# Patient Record
Sex: Female | Born: 1965 | Race: Black or African American | Hispanic: No | Marital: Single | Smoking: Current every day smoker
Health system: Southern US, Community
[De-identification: ages and names within clinical notes are randomized; demographics above are authoritative.]

## PROBLEM LIST (undated history)

## (undated) HISTORY — PX: SKIN BIOPSY: SHX1

---

## 2004-10-04 ENCOUNTER — Emergency Department: Payer: Self-pay | Admitting: Unknown Physician Specialty

## 2005-05-02 ENCOUNTER — Emergency Department: Payer: Self-pay | Admitting: Emergency Medicine

## 2005-05-16 ENCOUNTER — Emergency Department: Payer: Self-pay | Admitting: Emergency Medicine

## 2005-05-16 ENCOUNTER — Other Ambulatory Visit: Payer: Self-pay

## 2005-06-27 ENCOUNTER — Emergency Department: Payer: Self-pay | Admitting: Emergency Medicine

## 2005-09-19 ENCOUNTER — Emergency Department: Payer: Self-pay | Admitting: Internal Medicine

## 2006-12-27 ENCOUNTER — Emergency Department (HOSPITAL_COMMUNITY): Admission: EM | Admit: 2006-12-27 | Discharge: 2006-12-28 | Payer: Self-pay | Admitting: Emergency Medicine

## 2007-12-11 ENCOUNTER — Ambulatory Visit: Payer: Self-pay | Admitting: Internal Medicine

## 2009-02-11 ENCOUNTER — Ambulatory Visit: Payer: Self-pay | Admitting: Internal Medicine

## 2009-02-23 ENCOUNTER — Ambulatory Visit: Payer: Self-pay | Admitting: Internal Medicine

## 2011-01-06 LAB — URINALYSIS, ROUTINE W REFLEX MICROSCOPIC
Glucose, UA: NEGATIVE
Nitrite: NEGATIVE
Specific Gravity, Urine: 1.024
pH: 5.5

## 2011-01-06 LAB — URINE MICROSCOPIC-ADD ON

## 2011-01-06 LAB — URINE CULTURE

## 2012-09-02 ENCOUNTER — Emergency Department: Payer: Self-pay | Admitting: Emergency Medicine

## 2013-09-15 ENCOUNTER — Emergency Department: Payer: Self-pay | Admitting: Emergency Medicine

## 2013-10-15 ENCOUNTER — Emergency Department: Payer: Self-pay | Admitting: Internal Medicine

## 2013-10-15 LAB — COMPREHENSIVE METABOLIC PANEL
ALK PHOS: 82 U/L
ANION GAP: 5 — AB (ref 7–16)
AST: 38 U/L — AB (ref 15–37)
Albumin: 3.5 g/dL (ref 3.4–5.0)
BILIRUBIN TOTAL: 0.3 mg/dL (ref 0.2–1.0)
BUN: 8 mg/dL (ref 7–18)
Calcium, Total: 8.9 mg/dL (ref 8.5–10.1)
Chloride: 105 mmol/L (ref 98–107)
Co2: 28 mmol/L (ref 21–32)
Creatinine: 0.56 mg/dL — ABNORMAL LOW (ref 0.60–1.30)
Glucose: 91 mg/dL (ref 65–99)
Osmolality: 274 (ref 275–301)
Potassium: 4.5 mmol/L (ref 3.5–5.1)
SGPT (ALT): 22 U/L (ref 12–78)
SODIUM: 138 mmol/L (ref 136–145)
TOTAL PROTEIN: 7.7 g/dL (ref 6.4–8.2)

## 2013-10-15 LAB — URINALYSIS, COMPLETE
BACTERIA: NONE SEEN
BLOOD: NEGATIVE
Bilirubin,UR: NEGATIVE
Glucose,UR: NEGATIVE mg/dL (ref 0–75)
Ketone: NEGATIVE
LEUKOCYTE ESTERASE: NEGATIVE
Nitrite: NEGATIVE
PH: 5 (ref 4.5–8.0)
Protein: NEGATIVE
RBC,UR: <1 /HPF (ref 0–5)
SPECIFIC GRAVITY: 1.005 (ref 1.003–1.030)
Squamous Epithelial: <1
WBC UR: NONE SEEN /HPF (ref 0–5)

## 2013-10-15 LAB — CBC
HCT: 38.5 % (ref 35.0–47.0)
HGB: 13.2 g/dL (ref 12.0–16.0)
MCH: 30.1 pg (ref 26.0–34.0)
MCHC: 34.3 g/dL (ref 32.0–36.0)
MCV: 88 fL (ref 80–100)
Platelet: 277 10*3/uL (ref 150–440)
RBC: 4.38 10*6/uL (ref 3.80–5.20)
RDW: 14 % (ref 11.5–14.5)
WBC: 9.7 10*3/uL (ref 3.6–11.0)

## 2014-06-19 ENCOUNTER — Emergency Department: Payer: Self-pay | Admitting: Emergency Medicine

## 2014-07-07 ENCOUNTER — Emergency Department: Admit: 2014-07-07 | Disposition: A | Discharge status: Home / Self Care | Payer: Self-pay | Admitting: Student

## 2014-10-24 ENCOUNTER — Encounter: Payer: Self-pay | Admitting: *Deleted

## 2014-10-24 ENCOUNTER — Emergency Department
Admission: EM | Admit: 2014-10-24 | Discharge: 2014-10-24 | Disposition: A | Discharge status: Home / Self Care | Payer: Self-pay | Attending: Emergency Medicine | Admitting: Emergency Medicine

## 2014-10-24 DIAGNOSIS — Z880 Allergy status to penicillin: Secondary | ICD-10-CM | POA: Insufficient documentation

## 2014-10-24 DIAGNOSIS — Z720 Tobacco use: Secondary | ICD-10-CM | POA: Insufficient documentation

## 2014-10-24 DIAGNOSIS — L239 Allergic contact dermatitis, unspecified cause: Secondary | ICD-10-CM | POA: Insufficient documentation

## 2014-10-24 DIAGNOSIS — Z79899 Other long term (current) drug therapy: Secondary | ICD-10-CM | POA: Insufficient documentation

## 2014-10-24 DIAGNOSIS — L508 Other urticaria: Secondary | ICD-10-CM

## 2014-10-24 MED ORDER — RANITIDINE HCL 150 MG PO TABS: 150.0000 mg | ORAL_TABLET | Freq: Two times a day (BID) | ORAL | Status: DC

## 2014-10-24 MED ORDER — FAMOTIDINE 20 MG PO TABS
40.0000 mg | ORAL_TABLET | Freq: Once | ORAL | Status: AC
  Administered 2014-10-24: 40 mg via ORAL
  Filled 2014-10-24: qty 2

## 2014-10-24 MED ORDER — METHYLPREDNISOLONE 4 MG PO TBPK: ORAL_TABLET | ORAL | Status: DC

## 2014-10-24 MED ORDER — CYPROHEPTADINE HCL 4 MG PO TABS: 4.0000 mg | ORAL_TABLET | Freq: Three times a day (TID) | ORAL | Status: DC | PRN

## 2014-10-24 MED ORDER — CYPROHEPTADINE HCL 4 MG PO TABS
4.0000 mg | ORAL_TABLET | Freq: Three times a day (TID) | ORAL | Status: DC | PRN
Start: 2014-10-24 — End: 2014-10-24
  Filled 2014-10-24: qty 1

## 2014-10-24 MED ORDER — DEXAMETHASONE SODIUM PHOSPHATE 10 MG/ML IJ SOLN
10.0000 mg | Freq: Once | INTRAMUSCULAR | Status: AC
  Administered 2014-10-24: 10 mg via INTRAMUSCULAR
  Filled 2014-10-24: qty 1

## 2014-10-24 NOTE — ED Notes (Signed)
This am developed rash and some swelling on left hand

## 2014-10-24 NOTE — ED Provider Notes (Signed)
[facility] Emergency Department Provider Note ____________________________________________  Time seen: 0730  I have reviewed the triage vital signs and the nursing notes.  HISTORY  Chief Complaint  Rash  HPI Vanessa Jacobs is a 49 y.o. female who reports to the ED for evaluation of a rash that she developed just after she got in the shower this morning. She reports extremely itchy rash that seems to have spread from her arms up towards her torso, and also into her scalp. She denies any known exposure, but does report a history of severely sensitive skin, and previous hives outbreak. She normally doses Benadryl or Claritin daily for allergy relief as well as a multivitamin. She denies any particular exposure in the last 24 hours any new foods, allergens, or irritation contacts. She is without any swelling/edema/inflammation to the mouth, lips, or throat; and denies any shortness of breath or difficulty swallowing.  History reviewed. No pertinent past medical history.  There are no active problems to display for this patient.  Past Surgical History  Procedure Laterality Date  . Skin biopsy     Current Outpatient Rx  Name  Route  Sig  Dispense  Refill  . cyproheptadine (PERIACTIN) 4 MG tablet   Oral   Take 1 tablet (4 mg total) by mouth 3 (three) times daily as needed for allergies.   30 tablet   0   . methylPREDNISolone (MEDROL DOSEPAK) 4 MG TBPK tablet      Dose pack as directed   21 tablet   0   . ranitidine (ZANTAC) 150 MG tablet   Oral   Take 1 tablet (150 mg total) by mouth 2 (two) times daily.   20 tablet   0     Allergies Penicillins  No family history on file.  Social History History  Substance Use Topics  . Smoking status: Current Every Day Smoker  . Smokeless tobacco: Not on file  . Alcohol Use: No   Review of Systems  Constitutional: Negative for fever. Eyes: Negative for visual changes. ENT: Negative for sore  throat. Cardiovascular: Negative for chest pain. Respiratory: Negative for shortness of breath. Gastrointestinal: Negative for abdominal pain, vomiting and diarrhea. Genitourinary: Negative for dysuria. Musculoskeletal: Negative for back pain. Skin: Postive for rash. Neurological: Negative for headaches, focal weakness or numbness. ____________________________________________  PHYSICAL EXAM:  VITAL SIGNS: ED Triage Vitals  Enc Vitals Group     BP 10/24/14 0714 117/64 mmHg     Pulse Rate 10/24/14 0714 67     Resp 10/24/14 0714 20     Temp 10/24/14 0714 97.8 F (36.6 C)     Temp Source 10/24/14 0714 Oral     SpO2 10/24/14 0714 100 %     Weight 10/24/14 0714 122 lb (55.339 kg)     Height 10/24/14 0714 5\' 3"  (1.6 m)     Head Cir --      Peak Flow --      Pain Score --      Pain Loc --      Pain Edu? --      Excl. in GC? --    Constitutional: Alert and oriented. Well appearing and in no distress. Eyes: Conjunctivae are normal. PERRL. Normal extraocular movements. ENT   Head: Normocephalic and atraumatic.   Nose: No congestion/rhinnorhea.   Mouth/Throat: Mucous membranes are moist.   Neck: Supple. No thyromegaly. Hematological/Lymphatic/Immunilogical: No cervical lymphadenopathy. Cardiovascular: Normal rate, regular rhythm.  Respiratory: Normal respiratory effort. No wheezes/rales/rhonchi. Musculoskeletal:  Nontender with normal range of motion in all extremities.  Neurologic:  Normal gait without ataxia. Normal speech and language. No gross focal neurologic deficits are appreciated. Skin:  Skin is warm, dry and intact. Scattered, erythematous, papular rash noted to the arms, torso, and legs. Psychiatric: Mood and affect are normal. Patient exhibits appropriate insight and judgment. ____________________________________________  PROCEDURES  Famotodine 40 mg PO Decadron 10 mg IM ____________________________________________  INITIAL IMPRESSION / ASSESSMENT AND  PLAN / ED COURSE  Acute allergic dermatitis to unknown trigger. Consider idiopathic hives. Treatment of acute histamine response with antihistamine & anti-inflammatory medicines in the ED. Prescription for Medrol dose pack, Periactin, & ranitidine provided.  Work note for [facility] x 2 days provided as requested.  ____________________________________________  FINAL CLINICAL IMPRESSION(S) / ED DIAGNOSES  Final diagnoses:  Allergic dermatitis  Urticaria, acute     Lissa Hoard, PA-C 10/24/14 0757  Darien Ramus, MD 10/24/14 1539

## 2014-10-24 NOTE — ED Notes (Signed)
Scattered rash on skin, burns, then developed some swelling on her left hand

## 2014-10-24 NOTE — Discharge Instructions (Signed)
Allergies  Allergies may happen from anything your body is sensitive to. This may be food, medicines, pollens, chemicals, and many other things. Food allergies can be severe and deadly.  HOME CARE  If you do not know what causes a reaction, keep a diary. Write down the foods you ate and the symptoms that followed. Avoid foods that cause reactions.  If you have red raised spots (hives) or a rash:  Take medicine as told by your doctor.  Use medicines for red raised spots and itching as needed.  Apply cold cloths (compresses) to the skin. Take a cool bath. Avoid hot baths or showers.  If you are severely allergic:  It is often necessary to go to the hospital after you have treated your reaction.  Wear your medical alert jewelry.  You and your family must learn how to give a allergy shot or use an allergy kit (anaphylaxis kit).  Always carry your allergy kit or shot with you. Use this medicine as told by your doctor if a severe reaction is occurring. GET HELP RIGHT AWAY IF:  You have trouble breathing or are making high-pitched whistling sounds (wheezing).  You have a tight feeling in your chest or throat.  You have a puffy (swollen) mouth.  You have red raised spots, puffiness (swelling), or itching all over your body.  You have had a severe reaction that was helped by your allergy kit or shot. The reaction can return once the medicine has worn off.  You think you are having a food allergy. Symptoms most often happen within 30 minutes of eating a food.  Your symptoms have not gone away within 2 days or are getting worse.  You have new symptoms.  You want to retest yourself with a food or drink you think causes an allergic reaction. Only do this under the care of a doctor. MAKE SURE YOU:   Understand these instructions.  Will watch your condition.  Will get help right away if you are not doing well or get worse. Document Released: 07/09/2012 Document Reviewed:  07/09/2012 [facility] Patient Information 2015 [place]. This information is not intended to replace advice given to you by your health care provider. Make sure you discuss any questions you have with your health care provider.  Hives Hives are itchy, red, swollen areas of the skin. They can vary in size and location on your body. Hives can come and go for hours or several days (acute hives) or for several weeks (chronic hives). Hives do not spread from person to person (noncontagious). They may get worse with scratching, exercise, and emotional stress. CAUSES   Allergic reaction to food, additives, or drugs.  Infections, including the common cold.  Illness, such as vasculitis, lupus, or thyroid disease.  Exposure to sunlight, heat, or cold.  Exercise.  Stress.  Contact with chemicals. SYMPTOMS   Red or white swollen patches on the skin. The patches may change size, shape, and location quickly and repeatedly.  Itching.  Swelling of the hands, feet, and face. This may occur if hives develop deeper in the skin. DIAGNOSIS  Your caregiver can usually tell what is wrong by performing a physical exam. Skin or blood tests may also be done to determine the cause of your hives. In some cases, the cause cannot be determined. TREATMENT  Mild cases usually get better with medicines such as antihistamines. Severe cases may require an emergency epinephrine injection. If the cause of your hives is known, treatment includes avoiding  that trigger.  HOME CARE INSTRUCTIONS   Avoid causes that trigger your hives.  Take antihistamines as directed by your caregiver to reduce the severity of your hives. Non-sedating or low-sedating antihistamines are usually recommended. Do not drive while taking an antihistamine.  Take any other medicines prescribed for itching as directed by your caregiver.  Wear loose-fitting clothing.  Keep all follow-up appointments as directed by your caregiver. SEEK  MEDICAL CARE IF:   You have persistent or severe itching that is not relieved with medicine.  You have painful or swollen joints. SEEK IMMEDIATE MEDICAL CARE IF:   You have a fever.  Your tongue or lips are swollen.  You have trouble breathing or swallowing.  You feel tightness in the throat or chest.  You have abdominal pain. These problems may be the first sign of a life-threatening allergic reaction. Call your local emergency services (911 in [place] MAKE SURE YOU:   Understand these instructions.  Will watch your condition.  Will get help right away if you are not doing well or get worse. Document Released: 03/14/2005 Document Revised: 03/19/2013 Document Reviewed: 06/07/2011 [facility] Patient Information 2015 [place], [place]. This information is not intended to replace advice given to you by your health care provider. Make sure you discuss any questions you have with your health care provider.  Take the prescription steroid as directed. Dose the ranitidine twice daily for allergy relief.  Continue your daily anti-histamine as before. Follow-up with [facility] as needed.

## 2014-10-27 ENCOUNTER — Encounter: Payer: Self-pay | Admitting: Emergency Medicine

## 2014-10-27 ENCOUNTER — Emergency Department
Admission: EM | Admit: 2014-10-27 | Discharge: 2014-10-27 | Disposition: A | Discharge status: Home / Self Care | Payer: Self-pay | Attending: Student | Admitting: Student

## 2014-10-27 DIAGNOSIS — Y999 Unspecified external cause status: Secondary | ICD-10-CM | POA: Insufficient documentation

## 2014-10-27 DIAGNOSIS — X58XXXA Exposure to other specified factors, initial encounter: Secondary | ICD-10-CM | POA: Insufficient documentation

## 2014-10-27 DIAGNOSIS — Y939 Activity, unspecified: Secondary | ICD-10-CM | POA: Insufficient documentation

## 2014-10-27 DIAGNOSIS — Z880 Allergy status to penicillin: Secondary | ICD-10-CM | POA: Insufficient documentation

## 2014-10-27 DIAGNOSIS — Z720 Tobacco use: Secondary | ICD-10-CM | POA: Insufficient documentation

## 2014-10-27 DIAGNOSIS — Y929 Unspecified place or not applicable: Secondary | ICD-10-CM | POA: Insufficient documentation

## 2014-10-27 DIAGNOSIS — Z79899 Other long term (current) drug therapy: Secondary | ICD-10-CM | POA: Insufficient documentation

## 2014-10-27 DIAGNOSIS — S46912A Strain of unspecified muscle, fascia and tendon at shoulder and upper arm level, left arm, initial encounter: Secondary | ICD-10-CM | POA: Insufficient documentation

## 2014-10-27 MED ORDER — CYCLOBENZAPRINE HCL 10 MG PO TABS: 10.0000 mg | ORAL_TABLET | Freq: Three times a day (TID) | ORAL | Status: DC | PRN

## 2014-10-27 MED ORDER — TRAMADOL HCL 50 MG PO TABS
50.0000 mg | ORAL_TABLET | Freq: Four times a day (QID) | ORAL | Status: DC | PRN
Start: 2014-10-27 — End: 2015-07-16

## 2014-10-27 NOTE — ED Notes (Signed)
Pt here with c/o left shoulder pain and left arm pain, reports it radiates into back. Pt reports pain started this pain, reports it as a sharp pain with movement of left arm.

## 2014-10-27 NOTE — ED Provider Notes (Signed)
[facility] Emergency Department Provider Note  ____________________________________________  Time seen: Approximately 9:42 AM  I have reviewed the triage vital signs and the nursing notes.   HISTORY  Chief Complaint Shoulder Pain    HPI NEOMA UHRICH is a 49 y.o. female complaining of left posterior shoulder pain in the scapular area for one day. Patient states the pain in the inferior scapular area which increases with movement of the left shoulder. Patient also states that she take a deep breath she can feel pain in the posterior part of her back. Patient rates the pain as 8/10 describe it as sharp. No palliative measures for this complaint.   History reviewed. No pertinent past medical history.  There are no active problems to display for this patient.   Past Surgical History  Procedure Laterality Date  . Skin biopsy      Current Outpatient Rx  Name  Route  Sig  Dispense  Refill  . cyproheptadine (PERIACTIN) 4 MG tablet   Oral   Take 1 tablet (4 mg total) by mouth 3 (three) times daily as needed for allergies.   30 tablet   0   . methylPREDNISolone (MEDROL DOSEPAK) 4 MG TBPK tablet      Dose pack as directed   21 tablet   0   . ranitidine (ZANTAC) 150 MG tablet   Oral   Take 1 tablet (150 mg total) by mouth 2 (two) times daily.   20 tablet   0     Allergies Penicillins  No family history on file.  Social History History  Substance Use Topics  . Smoking status: Current Every Day Smoker    Types: Cigarettes  . Smokeless tobacco: Not on file  . Alcohol Use: No    Review of Systems Constitutional: No fever/chills Eyes: No visual changes. ENT: No sore throat. Cardiovascular: Denies chest pain. Respiratory: Denies shortness of breath. Gastrointestinal: No abdominal pain.  No nausea, no vomiting.  No diarrhea.  No constipation. Genitourinary: Negative for dysuria. Musculoskeletal: Left shoulder pain. Skin: Negative for  rash. Neurological: Negative for headaches, focal weakness or numbness. 10-point ROS otherwise negative.  ____________________________________________   PHYSICAL EXAM:  VITAL SIGNS: ED Triage Vitals  Enc Vitals Group     BP 10/27/14 0921 130/72 mmHg     Pulse Rate 10/27/14 0921 59     Resp 10/27/14 0921 19     Temp 10/27/14 0921 98.2 F (36.8 C)     Temp Source 10/27/14 0921 Oral     SpO2 10/27/14 0921 99 %     Weight 10/27/14 0921 135 lb (61.236 kg)     Height 10/27/14 0921  (1.6 m)     Head Cir --      Peak Flow --      Pain Score 10/27/14 0922 8     Pain Loc --      Pain Edu? --      Excl. in GC? --     Constitutional: Alert and oriented. Well appearing and in no acute distress. Eyes: Conjunctivae are normal. PERRL. EOMI. Head: Atraumatic. Nose: No congestion/rhinnorhea. Mouth/Throat: Mucous membranes are moist.  Oropharynx non-erythematous. Neck: No stridor.  No cervical spine tenderness to palpation. Hematological/Lymphatic/Immunilogical: No cervical lymphadenopathy. Cardiovascular: Normal rate, regular rhythm. Grossly normal heart sounds.  Good peripheral circulation. Respiratory: Normal respiratory effort.  No retractions. Lungs CTAB. Gastrointestinal: Soft and nontender. No distention. No abdominal bruits. No CVA tenderness. Musculoskeletal: No deformity left upper extremity. Patient  has some moderate guarding palpation inferior scapular muscle area. Decreased range of motion with adduction and overhead reaching of the left upper extremity. Left lower extremity is neurovascularly intact.  Neurologic:  Normal speech and language. No gross focal neurologic deficits are appreciated. No gait instability. Skin:  Skin is warm, dry and intact. No rash noted. Psychiatric: Mood and affect are normal. Speech and behavior are normal.  ____________________________________________   LABS (all labs ordered are listed, but only abnormal results are displayed)  Labs  Reviewed - No data to display ____________________________________________  EKG   ____________________________________________  RADIOLOGY   ____________________________________________   PROCEDURES  Procedure(s) performed: None  Critical Care performed: No  ____________________________________________   INITIAL IMPRESSION / ASSESSMENT AND PLAN / ED COURSE  Pertinent labs & imaging results that were available during my care of the patient were reviewed by me and considered in my medical decision making (see chart for details).  Left scapular muscle strain. Patient is given a sling to wear for the next 2-3 days. Patient given prescription for tramadol and ibuprofen. Patient given a work note. [facility]. ____________________________________________   FINAL CLINICAL IMPRESSION(S) / ED DIAGNOSES  Final diagnoses:  Muscle strain of left scapular region, initial encounter      Joni Reining, PA-C 10/27/14 1610  Gayla Doss, MD 10/27/14 1547

## 2015-02-20 ENCOUNTER — Emergency Department
Admission: EM | Admit: 2015-02-20 | Discharge: 2015-02-20 | Disposition: A | Discharge status: Home / Self Care | Payer: Self-pay | Attending: Emergency Medicine | Admitting: Emergency Medicine

## 2015-02-20 ENCOUNTER — Encounter: Payer: Self-pay | Admitting: Emergency Medicine

## 2015-02-20 DIAGNOSIS — L509 Urticaria, unspecified: Secondary | ICD-10-CM | POA: Insufficient documentation

## 2015-02-20 DIAGNOSIS — F17210 Nicotine dependence, cigarettes, uncomplicated: Secondary | ICD-10-CM | POA: Insufficient documentation

## 2015-02-20 DIAGNOSIS — Z880 Allergy status to penicillin: Secondary | ICD-10-CM | POA: Insufficient documentation

## 2015-02-20 DIAGNOSIS — L309 Dermatitis, unspecified: Secondary | ICD-10-CM | POA: Insufficient documentation

## 2015-02-20 MED ORDER — HYDROXYZINE HCL 50 MG PO TABS: 50.0000 mg | ORAL_TABLET | Freq: Three times a day (TID) | ORAL | Status: DC | PRN

## 2015-02-20 MED ORDER — DEXAMETHASONE SODIUM PHOSPHATE 10 MG/ML IJ SOLN
INTRAMUSCULAR | Status: AC
  Administered 2015-02-20: 10 mg via INTRAMUSCULAR
  Filled 2015-02-20: qty 1

## 2015-02-20 MED ORDER — HYDROCORTISONE VALERATE 0.2 % EX OINT: TOPICAL_OINTMENT | CUTANEOUS | Status: AC

## 2015-02-20 MED ORDER — METHYLPREDNISOLONE 4 MG PO TBPK: ORAL_TABLET | ORAL | Status: DC

## 2015-02-20 MED ORDER — HYDROXYZINE HCL 25 MG PO TABS
ORAL_TABLET | ORAL | Status: AC
  Administered 2015-02-20: 50 mg via ORAL
  Filled 2015-02-20: qty 2

## 2015-02-20 MED ORDER — DEXAMETHASONE SODIUM PHOSPHATE 10 MG/ML IJ SOLN
10.0000 mg | Freq: Once | INTRAMUSCULAR | Status: AC
  Administered 2015-02-20: 10 mg via INTRAMUSCULAR

## 2015-02-20 MED ORDER — HYDROXYZINE HCL 50 MG PO TABS
50.0000 mg | ORAL_TABLET | Freq: Once | ORAL | Status: AC
  Administered 2015-02-20: 50 mg via ORAL

## 2015-02-20 NOTE — ED Notes (Signed)
Pt c/o rash that she first noticed yesterday that is spread throughout her body. Pt took Zyrtec and generic benadryl yesterday. Did not help much with itching.  Did not change laundry detergent and recently went out and picked salad. Reports left arm weeps and crusty.

## 2015-02-20 NOTE — ED Notes (Signed)
RASH ALL OVER SINCE YESTERDAY.  SOME WITH PUS.

## 2015-02-20 NOTE — ED Provider Notes (Signed)
[facility] Emergency Department Provider Note  ____________________________________________  Time seen: Approximately 12:11 PM  I have reviewed the triage vital signs and the nursing notes.   HISTORY  Chief Complaint Rash    HPI Vanessa Jacobs is a 49 y.o. female complaining of a rash times one day. He states she knows a rash yesterday on her arms and neck and aspirated total body. Patient states she is taking some over-the-counter Benadryl and Zyrtec which did not help with the itching. Patient denies any recent change along she is on hygiene products. He stated 2 days ago she did go out to a restaurant and atypical cells. Patient has a history of eczema was seen to be compounded with this rash. Patient rates her discomfort as 8/10.   History reviewed. No pertinent past medical history.  There are no active problems to display for this patient.   Past Surgical History  Procedure Laterality Date  . Skin biopsy      Current Outpatient Rx  Name  Route  Sig  Dispense  Refill  . cyclobenzaprine (FLEXERIL) 10 MG tablet   Oral   Take 1 tablet (10 mg total) by mouth every 8 (eight) hours as needed for muscle spasms.   15 tablet   0   . cyproheptadine (PERIACTIN) 4 MG tablet   Oral   Take 1 tablet (4 mg total) by mouth 3 (three) times daily as needed for allergies.   30 tablet   0   . methylPREDNISolone (MEDROL DOSEPAK) 4 MG TBPK tablet      Dose pack as directed   21 tablet   0   . ranitidine (ZANTAC) 150 MG tablet   Oral   Take 1 tablet (150 mg total) by mouth 2 (two) times daily.   20 tablet   0   . traMADol (ULTRAM) 50 MG tablet   Oral   Take 1 tablet (50 mg total) by mouth every 6 (six) hours as needed for moderate pain.   12 tablet   0     Allergies Penicillins  No family history on file.  Social History Social History  Substance Use Topics  . Smoking status: Current Every Day Smoker    Types: Cigarettes  . Smokeless  tobacco: None  . Alcohol Use: No    Review of Systems Constitutional: No fever/chills Eyes: No visual changes. ENT: No sore throat. Cardiovascular: Denies chest pain. Respiratory: Denies shortness of breath. Gastrointestinal: No abdominal pain.  No nausea, no vomiting.  No diarrhea.  No constipation. Genitourinary: Negative for dysuria. Musculoskeletal: Negative for back pain. Skin: Positive for rash. Eczema Neurological: Negative for headaches, focal weakness or numbness. Hematological/Lymphatic: Allergic/Immunilogical: Penicillin 10-point ROS otherwise negative.  ____________________________________________   PHYSICAL EXAM:  VITAL SIGNS: ED Triage Vitals  Enc Vitals Group     BP --      Pulse Rate 02/20/15 1102 65     Resp 02/20/15 1102 14     Temp 02/20/15 1102 98.1 F (36.7 C)     Temp Source 02/20/15 1102 Oral     SpO2 02/20/15 1102 100 %     Weight 02/20/15 1102 128 lb (58.06 kg)     Height 02/20/15 1102  (1.6 m)     Head Cir --      Peak Flow --      Pain Score 02/20/15 1103 8     Pain Loc --      Pain Edu? --  Excl. in GC? --    Constitutional: Alert and oriented. Well appearing and in no acute distress. Eyes: Conjunctivae are normal. PERRL. EOMI. Head: Atraumatic. Nose: No congestion/rhinnorhea. Mouth/Throat: Mucous membranes are moist.  Oropharynx non-erythematous. Neck: No stridor.  No cervical spine tenderness to palpation. Hematological/Lymphatic/Immunilogical: No cervical lymphadenopathy. Cardiovascular: Normal rate, regular rhythm. Grossly normal heart sounds.  Good peripheral circulation. Respiratory: Normal respiratory effort.  No retractions. Lungs CTAB. Gastrointestinal: Soft and nontender. No distention. No abdominal bruits. No CVA tenderness. Musculoskeletal: No lower extremity tenderness nor edema.  No joint effusions. Neurologic:  Normal speech and language. No gross focal neurologic deficits are appreciated. No gait  instability. Skin:  Skin is warm, dry and intact. Diffuse papular lesions with signs excoriation. As a matter patches in the flexor aspect bilateral elbows. Psychiatric: Mood and affect are normal. Speech and behavior are normal.  ____________________________________________   LABS (all labs ordered are listed, but only abnormal results are displayed)  Labs Reviewed - No data to display ____________________________________________  EKG   ____________________________________________  RADIOLOGY   ____________________________________________   PROCEDURES  Procedure(s) performed: None  Critical Care performed: No  ____________________________________________   INITIAL IMPRESSION / ASSESSMENT AND PLAN / ED COURSE  Pertinent labs & imaging results that were available during my care of the patient were reviewed by me and considered in my medical decision making (see chart for details).  urticaria etiology unknown. Patient given Decadron 10 mg IM and Atarax 50 mg mg by mouth. She discharged prescription for Medrol dose pack, hydrocortisone and Atarax. [facility]. ____________________________________________   FINAL CLINICAL IMPRESSION(S) / ED DIAGNOSES  Final diagnoses:  Urticaria      Joni ReiningRonald K Larrissa Stivers, PA-C 02/20/15 1227  Arnaldo NatalPaul F Malinda, MD 02/21/15 [phone]

## 2015-02-20 NOTE — Discharge Instructions (Signed)
Hives Hives are itchy, red, swollen areas of the skin. They can vary in size and location on your body. Hives can come and go for hours or several days (acute hives) or for several weeks (chronic hives). Hives do not spread from person to person (noncontagious). They may get worse with scratching, exercise, and emotional stress. CAUSES   Allergic reaction to food, additives, or drugs.  Infections, including the common cold.  Illness, such as vasculitis, lupus, or thyroid disease.  Exposure to sunlight, heat, or cold.  Exercise.  Stress.  Contact with chemicals. SYMPTOMS   Red or white swollen patches on the skin. The patches may change size, shape, and location quickly and repeatedly.  Itching.  Swelling of the hands, feet, and face. This may occur if hives develop deeper in the skin. DIAGNOSIS  Your caregiver can usually tell what is wrong by performing a physical exam. Skin or blood tests may also be done to determine the cause of your hives. In some cases, the cause cannot be determined. TREATMENT  Mild cases usually get better with medicines such as antihistamines. Severe cases may require an emergency epinephrine injection. If the cause of your hives is known, treatment includes avoiding that trigger.  HOME CARE INSTRUCTIONS   Avoid causes that trigger your hives.  Take antihistamines as directed by your caregiver to reduce the severity of your hives. Non-sedating or low-sedating antihistamines are usually recommended. Do not drive while taking an antihistamine.  Take any other medicines prescribed for itching as directed by your caregiver.  Wear loose-fitting clothing.  Keep all follow-up appointments as directed by your caregiver. SEEK MEDICAL CARE IF:   You have persistent or severe itching that is not relieved with medicine.  You have painful or swollen joints. SEEK IMMEDIATE MEDICAL CARE IF:   You have a fever.  Your tongue or lips are swollen.  You have  trouble breathing or swallowing.  You feel tightness in the throat or chest.  You have abdominal pain. These problems may be the first sign of a life-threatening allergic reaction. Call your local emergency services (911 in [place] MAKE SURE YOU:   Understand these instructions.  Will watch your condition.  Will get help right away if you are not doing well or get worse.   This information is not intended to replace advice given to you by your health care provider. Make sure you discuss any questions you have with your health care provider.   Document Released: 03/14/2005 Document Revised: 03/19/2013 Document Reviewed: 06/07/2011 [employer] Interactive Patient Education 2016 [employer]  

## 2015-03-14 ENCOUNTER — Emergency Department: Admission: EM | Admit: 2015-03-14 | Discharge: 2015-03-14 | Discharge status: Absent without leave | Payer: Self-pay

## 2015-03-14 ENCOUNTER — Other Ambulatory Visit
Admission: RE | Admit: 2015-03-14 | Discharge: 2015-03-14 | Disposition: A | Discharge status: Home / Self Care | Attending: Family Medicine | Admitting: Family Medicine

## 2015-03-14 NOTE — ED Notes (Signed)
Pt taken from ed in custody of [place] [employer].

## 2015-03-14 NOTE — ED Notes (Signed)
Pt here for forensic blood draw with [place] [employer]. Officer lott is present for blood draw, lab released officer lott.

## 2015-07-16 ENCOUNTER — Emergency Department
Admission: EM | Admit: 2015-07-16 | Discharge: 2015-07-16 | Disposition: A | Discharge status: Home / Self Care | Payer: Self-pay | Attending: Emergency Medicine | Admitting: Emergency Medicine

## 2015-07-16 ENCOUNTER — Emergency Department: Payer: Self-pay

## 2015-07-16 DIAGNOSIS — Y929 Unspecified place or not applicable: Secondary | ICD-10-CM | POA: Insufficient documentation

## 2015-07-16 DIAGNOSIS — Y999 Unspecified external cause status: Secondary | ICD-10-CM | POA: Insufficient documentation

## 2015-07-16 DIAGNOSIS — S56301A Unspecified injury of extensor or abductor muscles, fascia and tendons of right thumb at forearm level, initial encounter: Secondary | ICD-10-CM | POA: Insufficient documentation

## 2015-07-16 DIAGNOSIS — F17210 Nicotine dependence, cigarettes, uncomplicated: Secondary | ICD-10-CM | POA: Insufficient documentation

## 2015-07-16 DIAGNOSIS — Y939 Activity, unspecified: Secondary | ICD-10-CM | POA: Insufficient documentation

## 2015-07-16 DIAGNOSIS — W1839XA Other fall on same level, initial encounter: Secondary | ICD-10-CM | POA: Insufficient documentation

## 2015-07-16 DIAGNOSIS — S6991XA Unspecified injury of right wrist, hand and finger(s), initial encounter: Secondary | ICD-10-CM

## 2015-07-16 MED ORDER — HYDROCODONE-ACETAMINOPHEN 5-325 MG PO TABS: 1.0000 | ORAL_TABLET | ORAL | Status: DC | PRN

## 2015-07-16 MED ORDER — MELOXICAM 15 MG PO TABS: 15.0000 mg | ORAL_TABLET | Freq: Every day | ORAL | Status: DC

## 2015-07-16 NOTE — ED Provider Notes (Signed)
CSN: [id number]     Arrival date & time 07/16/15  1239 History   First MD Initiated Contact with Patient 07/16/15 1334     Chief Complaint  Patient presents with  . Hand Pain     HPI   50 year old female who presents to the emergency department for evaluation of right thumb pain. She fell over uneven concrete and landed on both hands with her right thumb bent in an awkward position. She felt fine until Wednesday morning when she awakened with swelling in the thumb. No relief with ibuprofen and ice.  History reviewed. No pertinent past medical history. Past Surgical History  Procedure Laterality Date  . Skin biopsy     No family history on file. Social History  Substance Use Topics  . Smoking status: Current Every Day Smoker    Types: Cigarettes  . Smokeless tobacco: None  . Alcohol Use: No   OB History    No data available     Review of Systems  Constitutional: Negative.   Musculoskeletal: Positive for arthralgias.  Skin: Negative for color change and wound.  Neurological: Negative for numbness.      Allergies  Almond meal and Penicillins  Home Medications   Prior to Admission medications   Medication Sig Start Date End Date Taking? Authorizing Provider  cyclobenzaprine (FLEXERIL) 10 MG tablet Take 1 tablet (10 mg total) by mouth every 8 (eight) hours as needed for muscle spasms. 10/27/14   Joni Reiningonald K Smith, PA-C  cyproheptadine (PERIACTIN) 4 MG tablet Take 1 tablet (4 mg total) by mouth 3 (three) times daily as needed for allergies. 10/24/14   Jenise V Bacon Menshew, PA-C  HYDROcodone-acetaminophen (NORCO/VICODIN) 5-325 MG tablet Take 1 tablet by mouth every 4 (four) hours as needed for moderate pain. 07/16/15   Chinita Pesterari B Abdullah Rizzi, FNP  hydrocortisone valerate ointment (WESTCORT) 0.2 % Apply to affected area daily 02/20/15 02/20/16  Joni Reiningonald K Smith, PA-C  hydrOXYzine (ATARAX/VISTARIL) 50 MG tablet Take 1 tablet (50 mg total) by mouth 3 (three) times daily as needed. 02/20/15    Joni Reiningonald K Smith, PA-C  meloxicam (MOBIC) 15 MG tablet Take 1 tablet (15 mg total) by mouth daily. 07/16/15   Chinita Pesterari B Brooks Kinnan, FNP  methylPREDNISolone (MEDROL DOSEPAK) 4 MG TBPK tablet Dose pack as directed 10/24/14   Smith RobertJenise V Bacon Menshew, PA-C  methylPREDNISolone (MEDROL DOSEPAK) 4 MG TBPK tablet Take Tapered dose as directed 02/20/15   Joni Reiningonald K Smith, PA-C  ranitidine (ZANTAC) 150 MG tablet Take 1 tablet (150 mg total) by mouth 2 (two) times daily. 10/24/14   Jenise V Bacon Menshew, PA-C   BP 143/71 mmHg  Pulse 59  Temp(Src) 98.1 F (36.7 C) (Oral)  Resp 16  Ht 5\' 3"  (1.6 m)  Wt 58.968 kg  BMI 23.03 kg/m2  SpO2 100% Physical Exam  Constitutional: She is oriented to person, place, and time. She appears well-developed and well-nourished.  Eyes: Conjunctivae and EOM are normal.  Neck: Normal range of motion.  Pulmonary/Chest: Effort normal.  Musculoskeletal:       Arms: Neurological: She is alert and oriented to person, place, and time.  Skin: Skin is warm and dry.  Psychiatric: She has a normal mood and affect. Her behavior is normal. Judgment and thought content normal.  Nursing note and vitals reviewed.   ED Course  Procedures (including critical care time) Labs Review Labs Reviewed - No data to display  Imaging Review No results found. I have personally reviewed and evaluated  these images and lab results as part of my medical decision-making.   EKG Interpretation None      MDM   Final diagnoses:  Thumb injury, right, initial encounter    Aluminum foam splint applied by ER Tech. Neurovascularly intact post application. She will be given prescriptions for Vicodin and meloxicam. She was instructed to follow up with orthopedics for symptoms that are not improving over the week. She was instructed to return to the ER for symptoms that change or worsen if unable to schedule an appointment.    Chinita Pester, FNP 07/18/15 1710  Sharyn Creamer, MD 07/23/15 1606

## 2015-07-16 NOTE — ED Notes (Signed)
Pt states she tripped on the sidewalk on Sunday and has right thumb pain and swelling since

## 2015-07-16 NOTE — ED Notes (Signed)
Pt presents to ED today with complaints of pain to left thumb.  Pt reports falling on Sunday, bracing herself with both hands at time of fall.  Pt denies any injury at time of fall, states she woke up Wednesday with "excruciating pain" to that thumb.  Pt left thumb swollen, unable to perform full range of motion.

## 2015-07-25 ENCOUNTER — Emergency Department
Admission: EM | Admit: 2015-07-25 | Discharge: 2015-07-25 | Disposition: A | Discharge status: Home / Self Care | Payer: Self-pay | Attending: Emergency Medicine | Admitting: Emergency Medicine

## 2015-07-25 ENCOUNTER — Encounter: Payer: Self-pay | Admitting: Emergency Medicine

## 2015-07-25 DIAGNOSIS — Z880 Allergy status to penicillin: Secondary | ICD-10-CM | POA: Insufficient documentation

## 2015-07-25 DIAGNOSIS — L209 Atopic dermatitis, unspecified: Secondary | ICD-10-CM | POA: Insufficient documentation

## 2015-07-25 DIAGNOSIS — L03221 Cellulitis of neck: Secondary | ICD-10-CM | POA: Insufficient documentation

## 2015-07-25 DIAGNOSIS — Z79899 Other long term (current) drug therapy: Secondary | ICD-10-CM | POA: Insufficient documentation

## 2015-07-25 DIAGNOSIS — F17210 Nicotine dependence, cigarettes, uncomplicated: Secondary | ICD-10-CM | POA: Insufficient documentation

## 2015-07-25 MED ORDER — HYDROXYZINE HCL 50 MG PO TABS
50.0000 mg | ORAL_TABLET | Freq: Once | ORAL | Status: AC
  Administered 2015-07-25: 50 mg via ORAL
  Filled 2015-07-25: qty 1

## 2015-07-25 MED ORDER — PREDNISONE 10 MG (21) PO TBPK: ORAL_TABLET | ORAL | Status: DC

## 2015-07-25 MED ORDER — SULFAMETHOXAZOLE-TRIMETHOPRIM 800-160 MG PO TABS: 1.0000 | ORAL_TABLET | Freq: Two times a day (BID) | ORAL | Status: DC

## 2015-07-25 MED ORDER — HYDROXYZINE HCL 25 MG PO TABS: 25.0000 mg | ORAL_TABLET | Freq: Three times a day (TID) | ORAL | Status: DC | PRN

## 2015-07-25 NOTE — ED Notes (Signed)
Pt to ed with c/o rash all over body,  Pt reports rash started about Thursday.  Areas on body are noted in elbows, neck and lower legs.  +itching.

## 2015-07-25 NOTE — Discharge Instructions (Signed)
Cellulitis Cellulitis is an infection of the skin and the tissue beneath it. The infected area is usually red and tender. Cellulitis occurs most often in the arms and lower legs.  CAUSES  Cellulitis is caused by bacteria that enter the skin through cracks or cuts in the skin. The most common types of bacteria that cause cellulitis are staphylococci and streptococci. SIGNS AND SYMPTOMS   Redness and warmth.  Swelling.  Tenderness or pain.  Fever. DIAGNOSIS  Your health care provider can usually determine what is wrong based on a physical exam. Blood tests may also be done. TREATMENT  Treatment usually involves taking an antibiotic medicine. HOME CARE INSTRUCTIONS   Take your antibiotic medicine as directed by your health care provider. Finish the antibiotic even if you start to feel better.  Keep the infected arm or leg elevated to reduce swelling.  Apply a warm cloth to the affected area up to 4 times per day to relieve pain.  Take medicines only as directed by your health care provider.  Keep all follow-up visits as directed by your health care provider. SEEK MEDICAL CARE IF:   You notice red streaks coming from the infected area.  Your red area gets larger or turns dark in color.  Your bone or joint underneath the infected area becomes painful after the skin has healed.  Your infection returns in the same area or another area.  You notice a swollen bump in the infected area.  You develop new symptoms.  You have a fever. SEEK IMMEDIATE MEDICAL CARE IF:   You feel very sleepy.  You develop vomiting or diarrhea.  You have a general ill feeling (malaise) with muscle aches and pains.   This information is not intended to replace advice given to you by your health care provider. Make sure you discuss any questions you have with your health care provider.   Document Released: 12/22/2004 Document Revised: 12/03/2014 Document Reviewed: 05/30/2011 Elsevier Interactive  Patient Education 2016 [employer]  Eczema Eczema, also called atopic dermatitis, is a skin disorder that causes inflammation of the skin. It causes a red rash and dry, scaly skin. The skin becomes very itchy. Eczema is generally worse during the cooler winter months and often improves with the warmth of summer. Eczema usually starts showing signs in infancy. Some children outgrow eczema, but it may last through adulthood.  CAUSES  The exact cause of eczema is not known, but it appears to run in families. People with eczema often have a family history of eczema, allergies, asthma, or hay fever. Eczema is not contagious. Flare-ups of the condition may be caused by:   Contact with something you are sensitive or allergic to.   Stress. SIGNS AND SYMPTOMS  Dry, scaly skin.   Red, itchy rash.   Itchiness. This may occur before the skin rash and may be very intense.  DIAGNOSIS  The diagnosis of eczema is usually made based on symptoms and medical history. TREATMENT  Eczema cannot be cured, but symptoms usually can be controlled with treatment and other strategies. A treatment plan might include:  Controlling the itching and scratching.   Use over-the-counter antihistamines as directed for itching. This is especially useful at night when the itching tends to be worse.   Use over-the-counter steroid creams as directed for itching.   Avoid scratching. Scratching makes the rash and itching worse. It may also result in a skin infection (impetigo) due to a break in the skin caused by scratching.  Keeping the skin well moisturized with creams every day. This will seal in moisture and help prevent dryness. Lotions that contain alcohol and water should be avoided because they can dry the skin.   Limiting exposure to things that you are sensitive or allergic to (allergens).   Recognizing situations that cause stress.   Developing a plan to manage stress.  HOME CARE INSTRUCTIONS     Only take over-the-counter or prescription medicines as directed by your health care provider.   Do not use anything on the skin without checking with your health care provider.   Keep baths or showers short (5 minutes) in warm (not hot) water. Use mild cleansers for bathing. These should be unscented. You may add nonperfumed bath oil to the bath water. It is best to avoid soap and bubble bath.   Immediately after a bath or shower, when the skin is still damp, apply a moisturizing ointment to the entire body. This ointment should be a petroleum ointment. This will seal in moisture and help prevent dryness. The thicker the ointment, the better. These should be unscented.   Keep fingernails cut short. Children with eczema may need to wear soft gloves or mittens at night after applying an ointment.   Dress in clothes made of cotton or cotton blends. Dress lightly, because heat increases itching.   A child with eczema should stay away from anyone with fever blisters or cold sores. The virus that causes fever blisters (herpes simplex) can cause a serious skin infection in children with eczema. SEEK MEDICAL CARE IF:   Your itching interferes with sleep.   Your rash gets worse or is not better within 1 week after starting treatment.   You see pus or soft yellow scabs in the rash area.   You have a fever.   You have a rash flare-up after contact with someone who has fever blisters.    This information is not intended to replace advice given to you by your health care provider. Make sure you discuss any questions you have with your health care provider.   Document Released: 03/11/2000 Document Revised: 01/02/2013 Document Reviewed: 10/15/2012 Elsevier Interactive Patient Education [employer]

## 2015-07-25 NOTE — ED Provider Notes (Signed)
[facility] Emergency Department Provider Note ____________________________________________  Time seen: Approximately 3:01 PM  I have reviewed the triage vital signs and the nursing notes.   HISTORY  Chief Complaint Rash    HPI Vanessa Jacobs is a 50 y.o. female who presents to the emergency department for evaluation of rash. She states that it started suddenly around Thursday on her forearms and in the bends of her elbows. Now the rash is across the front of her neck, bilateral hips and ankles. The rash is very pruritic but it hurts to scratch it. She states that in the bins of her elbows, the skin feels as if it is cracking when she fully extends. She has been using over-the-counter hydrocortisone cream and unscented lotion without relief. She states that she had eczema as a child, but has not had any trouble for several years. She denies new exposures or new hygiene products. She is had no fever or other symptoms of allergic reaction. She has had no new medications.  History reviewed. No pertinent past medical history.  There are no active problems to display for this patient.   Past Surgical History  Procedure Laterality Date  . Skin biopsy      Current Outpatient Rx  Name  Route  Sig  Dispense  Refill  . cyclobenzaprine (FLEXERIL) 10 MG tablet   Oral   Take 1 tablet (10 mg total) by mouth every 8 (eight) hours as needed for muscle spasms.   15 tablet   0   . cyproheptadine (PERIACTIN) 4 MG tablet   Oral   Take 1 tablet (4 mg total) by mouth 3 (three) times daily as needed for allergies.   30 tablet   0   . HYDROcodone-acetaminophen (NORCO/VICODIN) 5-325 MG tablet   Oral   Take 1 tablet by mouth every 4 (four) hours as needed for moderate pain.   20 tablet   0   . hydrocortisone valerate ointment (WESTCORT) 0.2 %      Apply to affected area daily   45 g   1   . hydrOXYzine (ATARAX/VISTARIL) 25 MG tablet   Oral   Take 1 tablet (25 mg  total) by mouth 3 (three) times daily as needed.   30 tablet   0   . meloxicam (MOBIC) 15 MG tablet   Oral   Take 1 tablet (15 mg total) by mouth daily.   30 tablet   0   . methylPREDNISolone (MEDROL DOSEPAK) 4 MG TBPK tablet      Dose pack as directed   21 tablet   0   . methylPREDNISolone (MEDROL DOSEPAK) 4 MG TBPK tablet      Take Tapered dose as directed   21 tablet   0   . predniSONE (STERAPRED UNI-PAK 21 TAB) 10 MG (21) TBPK tablet      Take 6 tablets on day 1 Take 5 tablets on day 2 Take 4 tablets on day 3 Take 3 tablets on day 4 Take 2 tablets on day 5 Take 1 tablet on day 6   21 tablet   0   . ranitidine (ZANTAC) 150 MG tablet   Oral   Take 1 tablet (150 mg total) by mouth 2 (two) times daily.   20 tablet   0   . sulfamethoxazole-trimethoprim (BACTRIM DS,SEPTRA DS) 800-160 MG tablet   Oral   Take 1 tablet by mouth 2 (two) times daily.   20 tablet   0  Allergies Almond meal and Penicillins  History reviewed. No pertinent family history.  Social History Social History  Substance Use Topics  . Smoking status: Current Every Day Smoker    Types: Cigarettes  . Smokeless tobacco: None  . Alcohol Use: No    Review of Systems Constitutional: No recent illness. Recent fall with subsequent thumb injury. ENT: No sore throat. Respiratory: Denies shortness of breath or wheezing. Gastrointestinal: No abdominal pain. No vomiting or diarrhea. Musculoskeletal: Pain in right thumb from previous injury. Skin: Positive for rash. Neurological: Negative for headaches, focal weakness or numbness.   ____________________________________________   PHYSICAL EXAM:  VITAL SIGNS: ED Triage Vitals  Enc Vitals Group     BP 07/25/15 1347 134/65 mmHg     Pulse Rate 07/25/15 1347 68     Resp 07/25/15 1347 18     Temp 07/25/15 1347 98.2 F (36.8 C)     Temp Source 07/25/15 1347 Oral     SpO2 07/25/15 1347 95 %     Weight 07/25/15 1347 130 lb (58.968 kg)      Height --      Head Cir --      Peak Flow --      Pain Score 07/25/15 1348 0     Pain Loc --      Pain Edu? --      Excl. in GC? --     Constitutional: Alert and oriented. Well appearing and in no acute distress. Eyes: Conjunctivae are normal. EOMI. Head: Atraumatic. Nose: No congestion/rhinnorhea. Neck: No stridor.  Respiratory: Normal respiratory effort.   Musculoskeletal: Right thumb with decrease in swelling and improvement of ROM since last visit. Neurologic:  Normal speech and language. No gross focal neurologic deficits are appreciated. Speech is normal. No gait instability. Skin:  Confluent, erythematous papular areas in anterior neck, bilateral AC areas, bilateral hips, and bilateral ankles with scaling. Neck area more erythematous than the rest with a small indurated lesion on the right and is concerning for early cellulitis/secondary infection from scratching.  Psychiatric: Mood and affect are normal. Speech and behavior are normal.  ____________________________________________   LABS (all labs ordered are listed, but only abnormal results are displayed)  Labs Reviewed - No data to display ____________________________________________  RADIOLOGY  Not indicated. ____________________________________________   PROCEDURES  Procedure(s) performed: None   ____________________________________________   INITIAL IMPRESSION / ASSESSMENT AND PLAN / ED COURSE  Pertinent labs & imaging results that were available during my care of the patient were reviewed by me and considered in my medical decision making (see chart for details).  Patient will be prescribed Bactrim, prednisone taper, and hydroxyzine. She was advised that she will need follow-up with dermatology for symptoms that do not improve with these medications. She was instructed to return to the emergency department for concern of infection that is not improving while taking the Bactrim. She was instructed to  apply Aquaphor to the affected areas 2-3 times a day. ____________________________________________   FINAL CLINICAL IMPRESSION(S) / ED DIAGNOSES  Final diagnoses:  Atopic dermatitis  Cellulitis of neck       Chinita PesterCari B Elia Keenum, FNP 07/25/15 1540  Jene Everyobert Kinner, MD 07/25/15 2005

## 2015-12-19 ENCOUNTER — Encounter: Payer: Self-pay | Admitting: Emergency Medicine

## 2015-12-19 ENCOUNTER — Emergency Department
Admission: EM | Admit: 2015-12-19 | Discharge: 2015-12-19 | Disposition: A | Discharge status: Home / Self Care | Payer: Medicaid Other | Attending: Emergency Medicine | Admitting: Emergency Medicine

## 2015-12-19 DIAGNOSIS — F17210 Nicotine dependence, cigarettes, uncomplicated: Secondary | ICD-10-CM | POA: Insufficient documentation

## 2015-12-19 DIAGNOSIS — Z7952 Long term (current) use of systemic steroids: Secondary | ICD-10-CM | POA: Insufficient documentation

## 2015-12-19 DIAGNOSIS — Z79899 Other long term (current) drug therapy: Secondary | ICD-10-CM | POA: Insufficient documentation

## 2015-12-19 DIAGNOSIS — Z792 Long term (current) use of antibiotics: Secondary | ICD-10-CM | POA: Insufficient documentation

## 2015-12-19 DIAGNOSIS — Z791 Long term (current) use of non-steroidal anti-inflammatories (NSAID): Secondary | ICD-10-CM | POA: Insufficient documentation

## 2015-12-19 DIAGNOSIS — J069 Acute upper respiratory infection, unspecified: Secondary | ICD-10-CM | POA: Insufficient documentation

## 2015-12-19 MED ORDER — GUAIFENESIN-CODEINE 100-10 MG/5ML PO SYRP: 10.0000 mL | ORAL_SOLUTION | Freq: Three times a day (TID) | ORAL | 0 refills | Status: DC | PRN

## 2015-12-19 NOTE — ED Triage Notes (Signed)
Cough, congestion and right ear pain started last night.

## 2015-12-19 NOTE — ED Provider Notes (Signed)
[facility] Emergency Department Provider Note  ____________________________________________  Time seen: Approximately 3:42 PM  I have reviewed the triage vital signs and the nursing notes.   HISTORY  Chief Complaint Cough; Nasal Congestion; and Otalgia   HPI Vanessa Jacobs is a 50 y.o. female who presents to the emergency department for evaluation of cough, congestion, and right ear pain that started last night.No relief with OTC cold medications. She missed work today and needs a note.   History reviewed. No pertinent past medical history.  There are no active problems to display for this patient.   Past Surgical History:  Procedure Laterality Date  . SKIN BIOPSY      Prior to Admission medications   Medication Sig Start Date End Date Taking? Authorizing Provider  cyclobenzaprine (FLEXERIL) 10 MG tablet Take 1 tablet (10 mg total) by mouth every 8 (eight) hours as needed for muscle spasms. 10/27/14   Joni Reiningonald K Smith, PA-C  cyproheptadine (PERIACTIN) 4 MG tablet Take 1 tablet (4 mg total) by mouth 3 (three) times daily as needed for allergies. 10/24/14   Jenise V Bacon Menshew, PA-C  guaiFENesin-codeine (ROBITUSSIN AC) 100-10 MG/5ML syrup Take 10 mLs by mouth 3 (three) times daily as needed for cough. 12/19/15   Chinita Pesterari B Decklyn Hornik, FNP  HYDROcodone-acetaminophen (NORCO/VICODIN) 5-325 MG tablet Take 1 tablet by mouth every 4 (four) hours as needed for moderate pain. 07/16/15   Chinita Pesterari B Aizlyn Schifano, FNP  hydrocortisone valerate ointment (WESTCORT) 0.2 % Apply to affected area daily 02/20/15 02/20/16  Joni Reiningonald K Smith, PA-C  hydrOXYzine (ATARAX/VISTARIL) 25 MG tablet Take 1 tablet (25 mg total) by mouth 3 (three) times daily as needed. 07/25/15   Chinita Pesterari B Zaryia Markel, FNP  meloxicam (MOBIC) 15 MG tablet Take 1 tablet (15 mg total) by mouth daily. 07/16/15   Chinita Pesterari B Tynia Wiers, FNP  methylPREDNISolone (MEDROL DOSEPAK) 4 MG TBPK tablet Dose pack as directed 10/24/14   Charlesetta IvoryJenise V Bacon  Menshew, PA-C  methylPREDNISolone (MEDROL DOSEPAK) 4 MG TBPK tablet Take Tapered dose as directed 02/20/15   Joni Reiningonald K Smith, PA-C  predniSONE (STERAPRED UNI-PAK 21 TAB) 10 MG (21) TBPK tablet Take 6 tablets on day 1 Take 5 tablets on day 2 Take 4 tablets on day 3 Take 3 tablets on day 4 Take 2 tablets on day 5 Take 1 tablet on day 6 07/25/15   Dailah Opperman B Carissa Musick, FNP  ranitidine (ZANTAC) 150 MG tablet Take 1 tablet (150 mg total) by mouth 2 (two) times daily. 10/24/14   Jenise V Bacon Menshew, PA-C  sulfamethoxazole-trimethoprim (BACTRIM DS,SEPTRA DS) 800-160 MG tablet Take 1 tablet by mouth 2 (two) times daily. 07/25/15   Chinita Pesterari B Laylani Pudwill, FNP    Allergies Almond meal and Penicillins  No family history on file.  Social History Social History  Substance Use Topics  . Smoking status: Current Every Day Smoker    Types: Cigarettes  . Smokeless tobacco: Never Used  . Alcohol use No    Review of Systems Constitutional: No fever/chills ENT: Positive for sore throat. Cardiovascular: Denies chest pain. Respiratory: Negative shortness of breath. Positive for cough. Gastrointestinal: Negative nausea,  no vomiting.  Negative for diarrhea.  Musculoskeletal: Negative for body aches Skin: Negative for rash. Neurological: Negative for headaches ____________________________________________   PHYSICAL EXAM:  VITAL SIGNS: ED Triage Vitals  Enc Vitals Group     BP 12/19/15 1528 113/61     Pulse Rate 12/19/15 1528 78     Resp 12/19/15 1528 18  Temp 12/19/15 1528 98.4 F (36.9 C)     Temp Source 12/19/15 1528 Oral     SpO2 12/19/15 1528 95 %     Weight --      Height 12/19/15 1529 5\' 3"  (1.6 m)     Head Circumference --      Peak Flow --      Pain Score 12/19/15 1529 7     Pain Loc --      Pain Edu? --      Excl. in GC? --     Constitutional: Alert and oriented. Well appearing and in no acute distress. Eyes: Conjunctivae are normal. EOMI. Ears: Bilateral TM normal Nose: Sinus  congestion; no rhinnorhea. Mouth/Throat: Mucous membranes are moist.  Oropharynx mildly erythematous. Tonsils appear normal. Neck: No stridor.  Lymphatic: No cervical lymphadenopathy. Cardiovascular: Normal rate, regular rhythm. Grossly normal heart sounds.  Good peripheral circulation. Respiratory: Normal respiratory effort.  No retractions. Clear to auscultation. Gastrointestinal: Soft and nontender.  Musculoskeletal: FROM x 4 extremities.  Neurologic:  Normal speech and language.  Skin:  Skin is warm, dry and intact. No rash noted. Psychiatric: Mood and affect are normal. Speech and behavior are normal.  ____________________________________________   LABS (all labs ordered are listed, but only abnormal results are displayed)  Labs Reviewed - No data to display ____________________________________________  EKG   ____________________________________________  RADIOLOGY  Not indicated. ____________________________________________   PROCEDURES  Procedure(s) performed: None  Critical Care performed: No  ____________________________________________   INITIAL IMPRESSION / ASSESSMENT AND PLAN / ED COURSE  Clinical Course    Pertinent labs & imaging results that were available during my care of the patient were reviewed by me and considered in my medical decision making (see chart for details).   Patient is to take the Robitussin [facility] as prescribed. She is to follow up with the PCP for symptoms that are not improving over the next few days. ____________________________________________   FINAL CLINICAL IMPRESSION(S) / ED DIAGNOSES  Final diagnoses:  URI (upper respiratory infection)    Note:  This document was prepared using Dragon voice recognition software and may include unintentional dictation errors.     Chinita Pester, FNP 12/19/15 1918    Emily Filbert, MD 12/19/15 [phone]

## 2016-02-16 ENCOUNTER — Other Ambulatory Visit
Admit: 2016-02-16 | Discharge: 2016-02-16 | Disposition: A | Discharge status: Home / Self Care | Attending: Family Medicine | Admitting: Family Medicine

## 2016-02-16 NOTE — ED Notes (Signed)
Pt here for forensic blood draw only, search warrant in place. Pt compliant and agrees to have blood drawn. Blood drawn per hospital policy and [place] [employer]. Pt released with officer to go to jail.

## 2016-02-27 ENCOUNTER — Encounter: Payer: Self-pay | Admitting: Emergency Medicine

## 2016-02-27 ENCOUNTER — Emergency Department
Admission: EM | Admit: 2016-02-27 | Discharge: 2016-02-27 | Disposition: A | Discharge status: Home / Self Care | Attending: Emergency Medicine | Admitting: Emergency Medicine

## 2016-02-27 DIAGNOSIS — F17210 Nicotine dependence, cigarettes, uncomplicated: Secondary | ICD-10-CM | POA: Insufficient documentation

## 2016-02-27 DIAGNOSIS — Z791 Long term (current) use of non-steroidal anti-inflammatories (NSAID): Secondary | ICD-10-CM | POA: Insufficient documentation

## 2016-02-27 DIAGNOSIS — Z91010 Allergy to peanuts: Secondary | ICD-10-CM | POA: Insufficient documentation

## 2016-02-27 DIAGNOSIS — R21 Rash and other nonspecific skin eruption: Secondary | ICD-10-CM | POA: Insufficient documentation

## 2016-02-27 DIAGNOSIS — Z79899 Other long term (current) drug therapy: Secondary | ICD-10-CM | POA: Insufficient documentation

## 2016-02-27 MED ORDER — PREDNISONE 10 MG PO TABS: 10.0000 mg | ORAL_TABLET | Freq: Every day | ORAL | 0 refills | Status: DC

## 2016-02-27 MED ORDER — PERMETHRIN 5 % EX CREA: TOPICAL_CREAM | CUTANEOUS | 1 refills | Status: AC

## 2016-02-27 MED ORDER — DEXAMETHASONE SODIUM PHOSPHATE 4 MG/ML IJ SOLN
10.0000 mg | Freq: Once | INTRAMUSCULAR | Status: AC
  Administered 2016-02-27: 10 mg via INTRAMUSCULAR

## 2016-02-27 MED ORDER — DEXAMETHASONE SODIUM PHOSPHATE 4 MG/ML IJ SOLN
INTRAMUSCULAR | Status: DC
Start: 2016-02-27 — End: 2016-02-28
  Filled 2016-02-27: qty 3

## 2016-02-27 NOTE — ED Provider Notes (Signed)
[facility]-EMERGENCY DEPARTMENT Provider Note   CSN: [id number] Arrival date & time: 02/27/16  2205     History   Chief Complaint Chief Complaint  Patient presents with  . Rash    HPI Vanessa Jacobs is a 50 y.o. female presents to the emergency department for evaluation of skin rash. Patient's rash began this morning. She denies any new foods, medications, detergents or soaps. She describes a extremely pruritic rash along her chest, arms and in between the digits of her right hand. She has not slept at any new locations. No one at home has the same rash. She has tried Benadryl with minimal improvement. She denies any fevers, chest pain, shortness of breath, difficulty swallowing or facial swelling.  HPI  History reviewed. No pertinent past medical history.  There are no active problems to display for this patient.   Past Surgical History:  Procedure Laterality Date  . SKIN BIOPSY      OB History    No data available       Home Medications    Prior to Admission medications   Medication Sig Start Date End Date Taking? Authorizing Provider  cyclobenzaprine (FLEXERIL) 10 MG tablet Take 1 tablet (10 mg total) by mouth every 8 (eight) hours as needed for muscle spasms. 10/27/14   Joni Reiningonald K Smith, PA-C  cyproheptadine (PERIACTIN) 4 MG tablet Take 1 tablet (4 mg total) by mouth 3 (three) times daily as needed for allergies. 10/24/14   Jenise V Bacon Menshew, PA-C  guaiFENesin-codeine (ROBITUSSIN AC) 100-10 MG/5ML syrup Take 10 mLs by mouth 3 (three) times daily as needed for cough. 12/19/15   Chinita Pesterari B Triplett, FNP  HYDROcodone-acetaminophen (NORCO/VICODIN) 5-325 MG tablet Take 1 tablet by mouth every 4 (four) hours as needed for moderate pain. 07/16/15   Chinita Pesterari B Triplett, FNP  hydrOXYzine (ATARAX/VISTARIL) 25 MG tablet Take 1 tablet (25 mg total) by mouth 3 (three) times daily as needed. 07/25/15   Chinita Pesterari B Triplett, FNP  meloxicam (MOBIC) 15 MG tablet Take 1 tablet (15 mg total) by mouth  daily. 07/16/15   Chinita Pesterari B Triplett, FNP  methylPREDNISolone (MEDROL DOSEPAK) 4 MG TBPK tablet Dose pack as directed 10/24/14   Jenise V Bacon Menshew, PA-C  methylPREDNISolone (MEDROL DOSEPAK) 4 MG TBPK tablet Take Tapered dose as directed 02/20/15   Joni Reiningonald K Smith, PA-C  permethrin (ELIMITE) 5 % cream Apply once from chin down, leave on for 8-14 hours, rinse. 02/27/16 02/26/17  Evon Slackhomas C Chancellor Vanderloop, PA-C  predniSONE (DELTASONE) 10 MG tablet Take 1 tablet (10 mg total) by mouth daily. 6,5,4,3,2,1 six day taper 02/27/16   Evon Slackhomas C Rendell Thivierge, PA-C  ranitidine (ZANTAC) 150 MG tablet Take 1 tablet (150 mg total) by mouth 2 (two) times daily. 10/24/14   Jenise V Bacon Menshew, PA-C  sulfamethoxazole-trimethoprim (BACTRIM DS,SEPTRA DS) 800-160 MG tablet Take 1 tablet by mouth 2 (two) times daily. 07/25/15   Chinita Pesterari B Triplett, FNP    Family History History reviewed. No pertinent family history.  Social History Social History  Substance Use Topics  . Smoking status: Current Every Day Smoker    Packs/day: 0.50    Types: Cigarettes  . Smokeless tobacco: Never Used  . Alcohol use No     Allergies   Almond meal; Penicillins; Milk-related compounds; and Peanut-containing drug products   Review of Systems Review of Systems  Constitutional: Negative for chills and fever.  HENT: Negative for ear pain and sore throat.   Eyes: Negative for pain and visual disturbance.  Respiratory: Negative for cough and shortness of breath.   Cardiovascular: Negative for chest pain and palpitations.  Gastrointestinal: Negative for abdominal pain and vomiting.  Genitourinary: Negative for dysuria and hematuria.  Musculoskeletal: Negative for arthralgias and back pain.  Skin: Positive for rash. Negative for color change.  Neurological: Negative for seizures and syncope.  All other systems reviewed and are negative.    Physical Exam Updated Vital Signs BP (!) 108/54 (BP Location: Left Arm)   Pulse (!) 58   Temp 98.2 F  (36.8 C) (Oral)   Resp 18   Ht 5\' 3"  (1.6 m)   Wt 64.4 kg   SpO2 100%   BMI 25.15 kg/m   Physical Exam  Constitutional: She is oriented to person, place, and time. She appears well-developed and well-nourished. No distress.  HENT:  Head: Normocephalic and atraumatic.  Right Ear: External ear normal.  Left Ear: External ear normal.  Nose: Nose normal.  Mouth/Throat: Oropharynx is clear and moist. No oropharyngeal exudate.  No signs of facial swelling or angioedema.  Eyes: Conjunctivae are normal.  Neck: Normal range of motion. Neck supple.  Cardiovascular: Normal rate and regular rhythm.   No murmur heard. Pulmonary/Chest: Effort normal and breath sounds normal. No respiratory distress.  Abdominal: Soft. There is no tenderness.  Musculoskeletal: She exhibits no edema.  Neurological: She is alert and oriented to person, place, and time.  Skin: Skin is warm and dry.  Macular papular rash along the chest and flexor surfaces of the forearms and extending into the right hand in between the digits. There are mildly excoriated papules with centralized black dots. No fluctuance or vesicles.  Psychiatric: She has a normal mood and affect. Her behavior is normal. Judgment and thought content normal.  Nursing note and vitals reviewed.    ED Treatments / Results  Labs (all labs ordered are listed, but only abnormal results are displayed) Labs Reviewed - No data to display  EKG  EKG Interpretation None       Radiology No results found.  Procedures Procedures (including critical care time)  Medications Ordered in ED Medications  dexamethasone (DECADRON) injection 10 mg (not administered)     Initial Impression / Assessment and Plan / ED Course  I have reviewed the triage vital signs and the nursing notes.  Pertinent labs & imaging results that were available during my care of the patient were reviewed by me and considered in my medical decision making (see chart for  details).  Clinical Course     50 year old female with skin rash. Some lesions concerning for scabies. We'll treat with permethrin and give 6 day steroid taper. She is given 10 mg of dexamethasone in the emergency department to help with itching. She'll continue with Benadryl. She is educated on signs and symptoms return to the emergency department for.  Final Clinical Impressions(s) / ED Diagnoses   Final diagnoses:  Rash    New Prescriptions New Prescriptions   PERMETHRIN (ELIMITE) 5 % CREAM    Apply once from chin down, leave on for 8-14 hours, rinse.   PREDNISONE (DELTASONE) 10 MG TABLET    Take 1 tablet (10 mg total) by mouth daily. 6,5,4,3,2,1 six day taper     Evon Slackhomas C Hazeline Charnley, PA-C 02/27/16 2319    Sharman CheekPhillip Stafford, MD 03/03/16 [phone]

## 2016-02-27 NOTE — ED Triage Notes (Signed)
Pt c/o itchy rash started this am at right arm and has spread to neck, chest and left arm.  Pt reports eating [employer] on Thursday and "they use peanut oil and I'm allergic". Pt reports drinking nothing toady but cranberry juice and  2 x 50 mg benadryl today last at 1600.

## 2016-02-27 NOTE — Discharge Instructions (Signed)
Please take medications as prescribed and return to the ER for any worsening symptoms or urgent changes in her health. Follow-up with PCP or walk-in clinic in 5-6 days if no improvement.

## 2017-01-27 IMAGING — CR DG FINGER THUMB 2+V*R*
3 series · 3 of 3 positions shown · non-contrast
Comparison: September 02, 2012.

CLINICAL DATA: Acute right thumb pain after fall 4 days ago.
Initial encounter.

EXAM:
RIGHT THUMB 2+V

[finger ap]
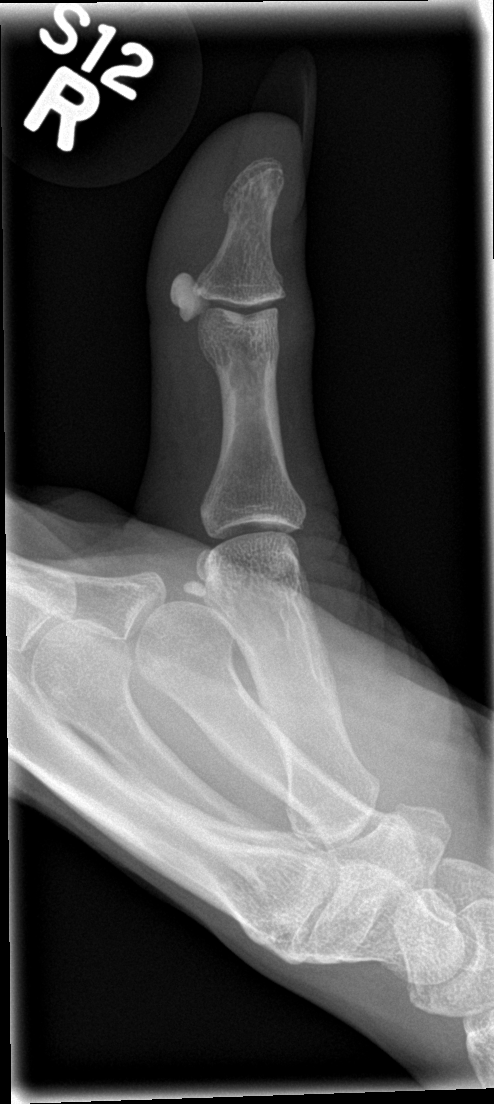

[finger obl]
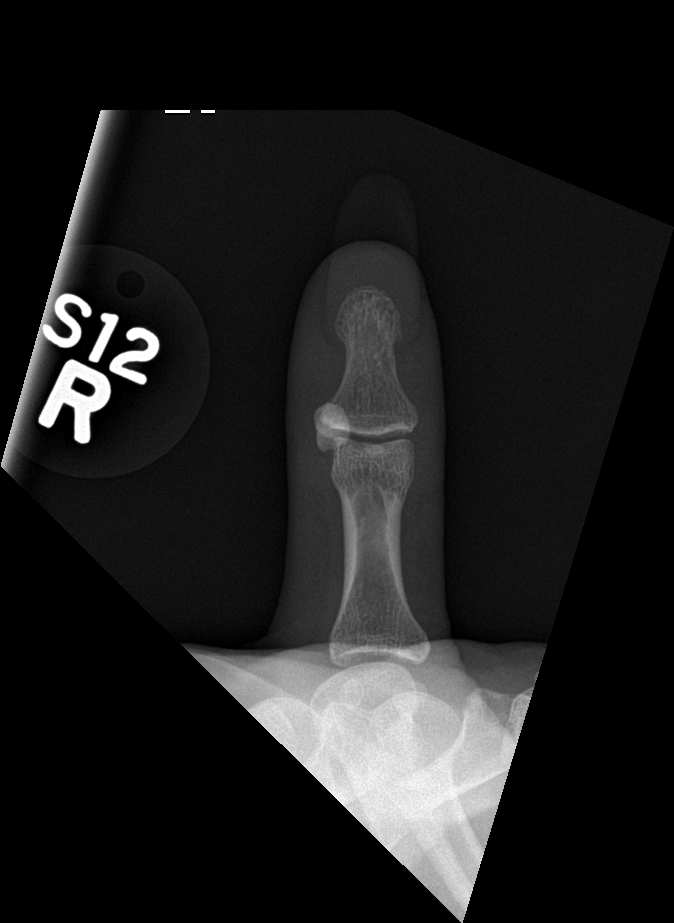

[finger lat]
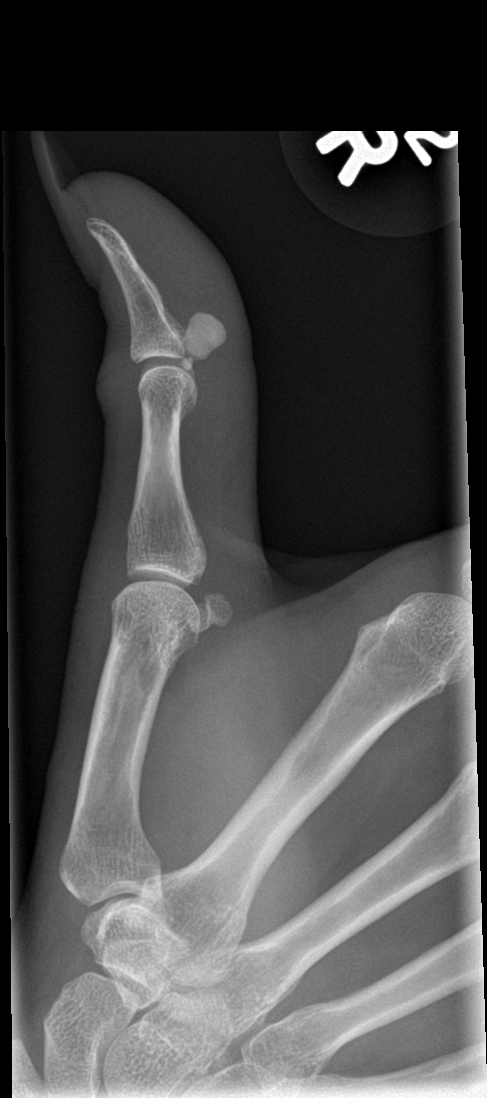

[3 of 3 positions shown; findings below may reference images not displayed]

FINDINGS: There is no evidence of fracture or dislocation. There is no
evidence of arthropathy. There is a prominent sesamoid bone next to
the interphalangeal joint which is significantly enlarged compared
to prior exam. Soft tissues are unremarkable
IMPRESSION: No fracture dislocation is noted. Prominent sesamoid bone seen
adjacent to interphalangeal joint which is significantly enlarged
compared to prior exam.

## 2017-10-29 ENCOUNTER — Emergency Department
Admission: EM | Admit: 2017-10-29 | Discharge: 2017-10-29 | Disposition: A | Discharge status: Home / Self Care | Payer: Self-pay | Attending: Emergency Medicine | Admitting: Emergency Medicine

## 2017-10-29 ENCOUNTER — Emergency Department: Payer: Self-pay

## 2017-10-29 ENCOUNTER — Other Ambulatory Visit: Payer: Self-pay

## 2017-10-29 ENCOUNTER — Encounter: Payer: Self-pay | Admitting: Emergency Medicine

## 2017-10-29 DIAGNOSIS — T50901A Poisoning by unspecified drugs, medicaments and biological substances, accidental (unintentional), initial encounter: Secondary | ICD-10-CM | POA: Insufficient documentation

## 2017-10-29 DIAGNOSIS — R002 Palpitations: Secondary | ICD-10-CM | POA: Insufficient documentation

## 2017-10-29 DIAGNOSIS — F17210 Nicotine dependence, cigarettes, uncomplicated: Secondary | ICD-10-CM | POA: Insufficient documentation

## 2017-10-29 LAB — CBC
HCT: 38.3 % (ref 35.0–47.0)
Hemoglobin: 13.6 g/dL (ref 12.0–16.0)
MCH: 30.3 pg (ref 26.0–34.0)
MCHC: 35.6 g/dL (ref 32.0–36.0)
MCV: 85.2 fL (ref 80.0–100.0)
PLATELETS: 308 10*3/uL (ref 150–440)
RBC: 4.5 MIL/uL (ref 3.80–5.20)
RDW: 13.6 % (ref 11.5–14.5)
WBC: 9.4 10*3/uL (ref 3.6–11.0)

## 2017-10-29 LAB — URINE DRUG SCREEN, QUALITATIVE (ARMC ONLY)
Amphetamines, Ur Screen: NOT DETECTED
Barbiturates, Ur Screen: NOT DETECTED
CANNABINOID 50 NG, UR ~~LOC~~: POSITIVE — AB
Cocaine Metabolite,Ur ~~LOC~~: NOT DETECTED
MDMA (Ecstasy)Ur Screen: NOT DETECTED
METHADONE SCREEN, URINE: NOT DETECTED
OPIATE, UR SCREEN: NOT DETECTED
Phencyclidine (PCP) Ur S: NOT DETECTED
Tricyclic, Ur Screen: NOT DETECTED

## 2017-10-29 LAB — BASIC METABOLIC PANEL
Anion gap: 10 (ref 5–15)
BUN: 12 mg/dL (ref 6–20)
CALCIUM: 9.3 mg/dL (ref 8.9–10.3)
CO2: 25 mmol/L (ref 22–32)
CREATININE: 0.55 mg/dL (ref 0.44–1.00)
Chloride: 103 mmol/L (ref 98–111)
GFR calc non Af Amer: >60 mL/min (ref >60)
Glucose, Bld: 79 mg/dL (ref 70–99)
Potassium: 4 mmol/L (ref 3.5–5.1)
SODIUM: 138 mmol/L (ref 135–145)

## 2017-10-29 LAB — TROPONIN I

## 2017-10-29 LAB — POCT PREGNANCY, URINE: Preg Test, Ur: NEGATIVE

## 2017-10-29 MED ORDER — LORAZEPAM 2 MG/ML IJ SOLN
1.0000 mg | Freq: Once | INTRAMUSCULAR | Status: AC
  Administered 2017-10-29: 1 mg via INTRAVENOUS
  Filled 2017-10-29: qty 1

## 2017-10-29 MED ORDER — SODIUM CHLORIDE 0.9 % IV BOLUS
500.0000 mL | Freq: Once | INTRAVENOUS | Status: AC
  Administered 2017-10-29: 500 mL via INTRAVENOUS
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
## 2017-11-16 ENCOUNTER — Emergency Department
Admission: EM | Admit: 2017-11-16 | Discharge: 2017-11-16 | Disposition: A | Discharge status: Home / Self Care | Payer: Medicaid Other | Attending: Student in an Organized Health Care Education/Training Program | Admitting: Student in an Organized Health Care Education/Training Program

## 2017-11-16 ENCOUNTER — Encounter: Payer: Self-pay | Admitting: *Deleted

## 2017-11-16 ENCOUNTER — Other Ambulatory Visit: Payer: Self-pay

## 2017-11-16 DIAGNOSIS — F17210 Nicotine dependence, cigarettes, uncomplicated: Secondary | ICD-10-CM | POA: Insufficient documentation

## 2017-11-16 DIAGNOSIS — H00025 Hordeolum internum left lower eyelid: Secondary | ICD-10-CM | POA: Insufficient documentation

## 2017-11-16 DIAGNOSIS — Z91010 Allergy to peanuts: Secondary | ICD-10-CM | POA: Insufficient documentation

## 2017-11-16 MED ORDER — CLINDAMYCIN HCL 150 MG PO CAPS
300.0000 mg | ORAL_CAPSULE | Freq: Once | ORAL | Status: AC
  Administered 2017-11-16: 300 mg via ORAL
  Filled 2017-11-16: qty 2

## 2017-11-16 MED ORDER — CLINDAMYCIN HCL 150 MG PO CAPS: 300.0000 mg | ORAL_CAPSULE | Freq: Three times a day (TID) | ORAL | 0 refills | Status: DC

## 2017-11-16 MED ORDER — TOBRAMYCIN 0.3 % OP SOLN: 2.0000 [drp] | OPHTHALMIC | 0 refills | Status: DC

## 2017-11-16 NOTE — ED Notes (Signed)
See triage note  Presents with redness and periorbital swelling to left eye  States she felt some discomfort yesterday

## 2017-11-16 NOTE — ED Provider Notes (Signed)
[facility] Emergency Department Provider Note  ____________________________________________   First MD Initiated Contact with Patient 11/16/17 1731     (approximate)  I have reviewed the triage vital signs and the nursing notes.   HISTORY  Chief Complaint Stye    HPI Vanessa Jacobs is a 52 y.o. female presents emergency department complaining of redness to the left eye with periorbital swelling which started this afternoon.  She states she has been applying a warm compress to the area she thought she had a stye last night.  She did have some watery mucus from the left eye.  She denies any fever or chills.  She denies any change in vision.    No past medical history on file.  There are no active problems to display for this patient.   Past Surgical History:  Procedure Laterality Date  . SKIN BIOPSY      Prior to Admission medications   Medication Sig Start Date End Date Taking? Authorizing Provider  clindamycin (CLEOCIN) 150 MG capsule Take 2 capsules (300 mg total) by mouth 3 (three) times daily. 11/16/17   Fisher, Roselyn BeringSusan W, PA-C  predniSONE (DELTASONE) 10 MG tablet Take 1 tablet (10 mg total) by mouth daily. 6,5,4,3,2,1 six day taper 02/27/16   Evon SlackGaines, Thomas C, PA-C  tobramycin (TOBREX) 0.3 % ophthalmic solution Place 2 drops into the left eye every 4 (four) hours. 11/16/17   Fisher, Roselyn BeringSusan W, PA-C    Allergies Almond meal; Penicillins; Milk-related compounds; and Peanut-containing drug products  No family history on file.  Social History Social History   Tobacco Use  . Smoking status: Current Every Day Smoker    Packs/day: 0.50    Types: Cigarettes  . Smokeless tobacco: Never Used  Substance Use Topics  . Alcohol use: Yes  . Drug use: No    Review of Systems  Constitutional: No fever/chills Eyes: No visual changes.  Positive redness and swelling to the left eye and periorbital area ENT: No sore throat. Respiratory: Denies  cough Genitourinary: Negative for dysuria. Musculoskeletal: Negative for back pain. Skin: Negative for rash.    ____________________________________________   PHYSICAL EXAM:  VITAL SIGNS: ED Triage Vitals [11/16/17 1711]  Enc Vitals Group     BP (!) 171/83     Pulse Rate 64     Resp 18     Temp 98.6 F (37 C)     Temp Source Oral     SpO2 99 %     Weight 150 lb (68 kg)     Height 5\' 3"  (1.6 m)     Head Circumference      Peak Flow      Pain Score 10     Pain Loc      Pain Edu?      Excl. in GC?     Constitutional: Alert and oriented. Well appearing and in no acute distress. Eyes: Conjunctiva of the left eye is injected, the lower lid appears to have a internal stye.  There is redness and swelling noted a little on the upper lid but mainly in the area below the left eye.  Questionable burn versus cellulitis. Head: Atraumatic. Nose: No congestion/rhinnorhea. Mouth/Throat: Mucous membranes are moist.   Neck:  supple no lymphadenopathy noted Cardiovascular: Normal rate, regular rhythm. Heart sounds are normal Respiratory: Normal respiratory effort.  No retractions, lungs c t a  GU: deferred Musculoskeletal: FROM all extremities, warm and well perfused Neurologic:  Normal speech and language.  Skin:  Skin is warm, dry and intact. No rash noted.  Positive for redness in the periorbital area Psychiatric: Mood and affect are normal. Speech and behavior are normal.  ____________________________________________   LABS (all labs ordered are listed, but only abnormal results are displayed)  Labs Reviewed - No data to display ____________________________________________   ____________________________________________  RADIOLOGY    ____________________________________________   PROCEDURES  Procedure(s) performed: No  Procedures    ____________________________________________   INITIAL IMPRESSION / ASSESSMENT AND PLAN / ED COURSE  Pertinent labs & imaging  results that were available during my care of the patient were reviewed by me and considered in my medical decision making (see chart for details).   Patient is 52 year old female presents emergency department with redness and periorbital swelling to the left eye.  She states symptoms started this morning.  She did have some tenderness like a stye from the left lower lid last night.  She denies any fever or chills.  She denies change in vision.  On physical exam the left eye is injected.  The left lower lid appears to have an internal stye.  There is some periorbital swelling below the left eye and some along the upper left lid.  No drainage is noted at this time.  No pain with extraocular motions.  Explained the findings to the patient.  She is given strict precautions to return if worsening.  Or call the eye doctor and see them immediately.  She was given a dose of an antibiotic tonight.  She was given a prescription for clindamycin 300 mg 3 times daily and tobramycin ophthalmic drops.  She is to return to the emergency department if worsening.  Call the eye doctor if not better in 1 to 2 days.  She states she understands will comply.  She was discharged in stable condition.     As part of my medical decision making, I reviewed the following data within the electronic MEDICAL RECORD NUMBER Nursing notes reviewed and incorporated, Old chart reviewed, Notes from prior ED visits and [place] Controlled Substance Database  ____________________________________________   FINAL CLINICAL IMPRESSION(S) / ED DIAGNOSES  Final diagnoses:  Hordeolum internum of left lower eyelid      NEW MEDICATIONS STARTED DURING THIS VISIT:  Discharge Medication List as of 11/16/2017  6:48 PM    START taking these medications   Details  clindamycin (CLEOCIN) 150 MG capsule Take 2 capsules (300 mg total) by mouth 3 (three) times daily., Starting Thu 11/16/2017, Normal    tobramycin (TOBREX) 0.3 % ophthalmic solution Place 2  drops into the left eye every 4 (four) hours., Starting Thu 11/16/2017, Normal         Note:  This document was prepared using Dragon voice recognition software and may include unintentional dictation errors.    Faythe Ghee, PA-C 11/16/17 2131    Willy Eddy, MD 11/16/17 [phone]

## 2017-11-16 NOTE — Discharge Instructions (Signed)
Follow-up with your regular doctor or call [facility] if you are worsening overnight.  If he cannot get in touch with them you may return to the emergency department for further evaluation.  Take the medication as prescribed.  Apply a warm compress 3-4 times a day.

## 2017-11-16 NOTE — ED Triage Notes (Signed)
Pt has a sty to left eye since last night.  Pt reports some drainage from left eye.  No injury to eye.  Pt alert

## 2018-04-03 ENCOUNTER — Encounter: Payer: Self-pay | Admitting: Emergency Medicine

## 2018-04-03 ENCOUNTER — Emergency Department
Admission: EM | Admit: 2018-04-03 | Discharge: 2018-04-03 | Disposition: A | Discharge status: Home / Self Care | Payer: Medicaid Other | Attending: Emergency Medicine | Admitting: Emergency Medicine

## 2018-04-03 DIAGNOSIS — F17210 Nicotine dependence, cigarettes, uncomplicated: Secondary | ICD-10-CM | POA: Insufficient documentation

## 2018-04-03 DIAGNOSIS — L259 Unspecified contact dermatitis, unspecified cause: Secondary | ICD-10-CM

## 2018-04-03 MED ORDER — TRIAMCINOLONE ACETONIDE 0.1 % EX CREA: 1.0000 "application " | TOPICAL_CREAM | Freq: Two times a day (BID) | CUTANEOUS | 0 refills | Status: AC

## 2018-04-03 MED ORDER — PREDNISONE 10 MG (21) PO TBPK: ORAL_TABLET | ORAL | 0 refills | Status: AC

## 2018-04-03 MED ORDER — DEXAMETHASONE SODIUM PHOSPHATE 10 MG/ML IJ SOLN
10.0000 mg | Freq: Once | INTRAMUSCULAR | Status: AC
  Administered 2018-04-03: 10 mg via INTRAMUSCULAR
  Filled 2018-04-03: qty 1

## 2018-04-03 NOTE — ED Provider Notes (Signed)
[facility] Emergency Department Provider Note  ____________________________________________   First MD Initiated Contact with Patient 04/03/18 [id number]     (approximate)  I have reviewed the triage vital signs and the nursing notes.   HISTORY  Chief Complaint Rash    HPI Vanessa Jacobs is a 53 y.o. female presents emergency department stating that she wear latex gloves other day and now her hands are peeling.  She states she has a history of eczema.  She states this is much worse than it is normally been.  She awoke with dryness and itching this morning.  She denies any fever or chills.  She denies any recent new medications.  She denies sore throat.    History reviewed. No pertinent past medical history.  There are no active problems to display for this patient.   Past Surgical History:  Procedure Laterality Date  . SKIN BIOPSY      Prior to Admission medications   Medication Sig Start Date End Date Taking? Authorizing Provider  predniSONE (STERAPRED UNI-PAK 21 TAB) 10 MG (21) TBPK tablet Take 6 pills on day one then decrease by 1 pill each day 04/03/18   Faythe GheeFisher, Kiron Osmun W, PA-C  triamcinolone cream (KENALOG) 0.1 % Apply 1 application topically 2 (two) times daily. 04/03/18   Elon Eoff, Roselyn BeringSusan W, PA-C    Allergies Almond meal; Penicillins; Milk-related compounds; and Peanut-containing drug products  No family history on file.  Social History Social History   Tobacco Use  . Smoking status: Current Every Day Smoker    Packs/day: 0.50    Types: Cigarettes  . Smokeless tobacco: Never Used  Substance Use Topics  . Alcohol use: Yes  . Drug use: No    Review of Systems  Constitutional: No fever/chills Eyes: No visual changes. ENT: No sore throat. Respiratory: Denies cough Genitourinary: Negative for dysuria. Musculoskeletal: Negative for back pain. Skin: Positive for peeling skin on her  hands    ____________________________________________   PHYSICAL EXAM:  VITAL SIGNS: ED Triage Vitals  Enc Vitals Group     BP 04/03/18 0905 140/78     Pulse Rate 04/03/18 0905 64     Resp 04/03/18 0905 16     Temp 04/03/18 0905 98.2 F (36.8 C)     Temp Source 04/03/18 0905 Oral     SpO2 04/03/18 0905 97 %     Weight 04/03/18 0853 147 lb (66.7 kg)     Height 04/03/18 0853 5\' 3"  (1.6 m)     Head Circumference --      Peak Flow --      Pain Score 04/03/18 0853 10     Pain Loc --      Pain Edu? --      Excl. in GC? --     Constitutional: Alert and oriented. Well appearing and in no acute distress. Eyes: Conjunctivae are normal.  Head: Atraumatic. Nose: No congestion/rhinnorhea. Mouth/Throat: Mucous membranes are moist.   Neck:  supple no lymphadenopathy noted Cardiovascular: Normal rate, regular rhythm. Heart sounds are normal Respiratory: Normal respiratory effort.  No retractions, lungs c t a  GU: deferred Musculoskeletal: FROM all extremities, warm and well perfused Neurologic:  Normal speech and language.  Skin:  Skin is warm, dry , fingers are dry and peeling, some are cracked, no drainage is noted.   Psychiatric: Mood and affect are normal. Speech and behavior are normal.  ____________________________________________   LABS (all labs ordered are listed, but only abnormal results are  displayed)  Labs Reviewed - No data to display ____________________________________________   ____________________________________________  RADIOLOGY    ____________________________________________   PROCEDURES  Procedure(s) performed: Decadron 10 mg IM  Procedures    ____________________________________________   INITIAL IMPRESSION / ASSESSMENT AND PLAN / ED COURSE  Pertinent labs & imaging results that were available during my care of the patient were reviewed by me and considered in my medical decision making (see chart for details).   Patient is  53 year old female presents emergency department complaint of a rash on her hands after wearing latex gloves.  Physical exam shows that the hands are irritated and the fingers are peeling and cracked  Patient was given an injection of Decadron 10 mg IM.  Prescription for Sterapred and triamcinolone cream.  She is to follow-up with 1 of the dermatologist if not improving in 5 to 7 days.  Return emergency department worsening.  She states she understands will comply.  She is discharged in stable condition.     As part of my medical decision making, I reviewed the following data within the electronic MEDICAL RECORD NUMBER Nursing notes reviewed and incorporated, Old chart reviewed, Notes from prior ED visits and  Controlled Substance Database  ____________________________________________   FINAL CLINICAL IMPRESSION(S) / ED DIAGNOSES  Final diagnoses:  Acute contact dermatitis      NEW MEDICATIONS STARTED DURING THIS VISIT:  New Prescriptions   PREDNISONE (STERAPRED UNI-PAK 21 TAB) 10 MG (21) TBPK TABLET    Take 6 pills on day one then decrease by 1 pill each day   TRIAMCINOLONE CREAM (KENALOG) 0.1 %    Apply 1 application topically 2 (two) times daily.     Note:  This document was prepared using Dragon voice recognition software and may include unintentional dictation errors.    Faythe Ghee, PA-C 04/03/18 4081    Sharman Cheek, MD 04/09/18 [phone]

## 2018-04-03 NOTE — ED Triage Notes (Signed)
Pt reports skin on hands have been peeling for the past 3 days. Denies other sx's. Reports thinks she touched something.

## 2018-04-03 NOTE — Discharge Instructions (Addendum)
Follow-up with 1 of the previous dermatologist if you are not better in 3 to 5 days.  Please call for an appointment.  Use medications as prescribed.  When you apply the triamcinolone cream follow it with a lotion such as Cetaphil or Eucerin cream.  This will help your hands heal quicker.

## 2019-05-13 IMAGING — CR DG CHEST 2V
2 series · 2 of 2 positions shown · non-contrast
Comparison: 05/17/2005

CLINICAL DATA: Palpitations.

EXAM:
CHEST - 2 VIEW

[chest pa]
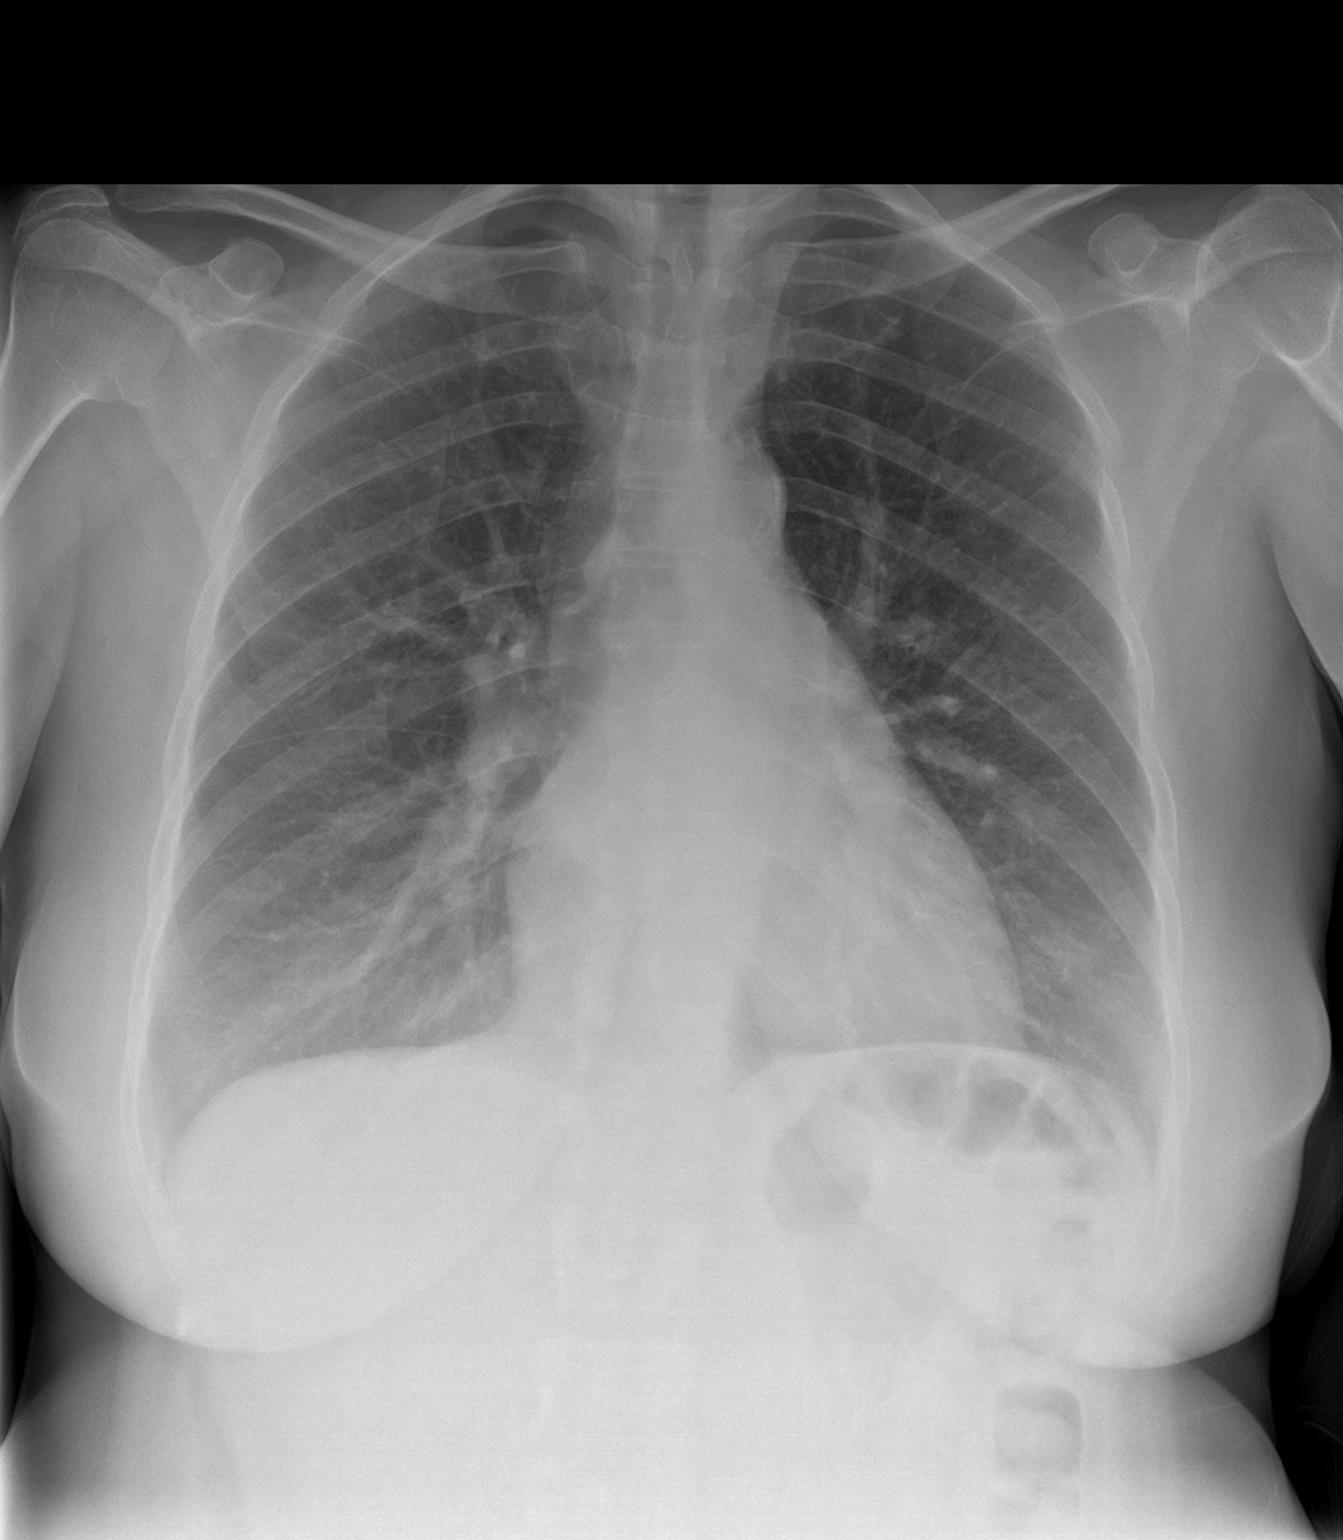

[chest lat]
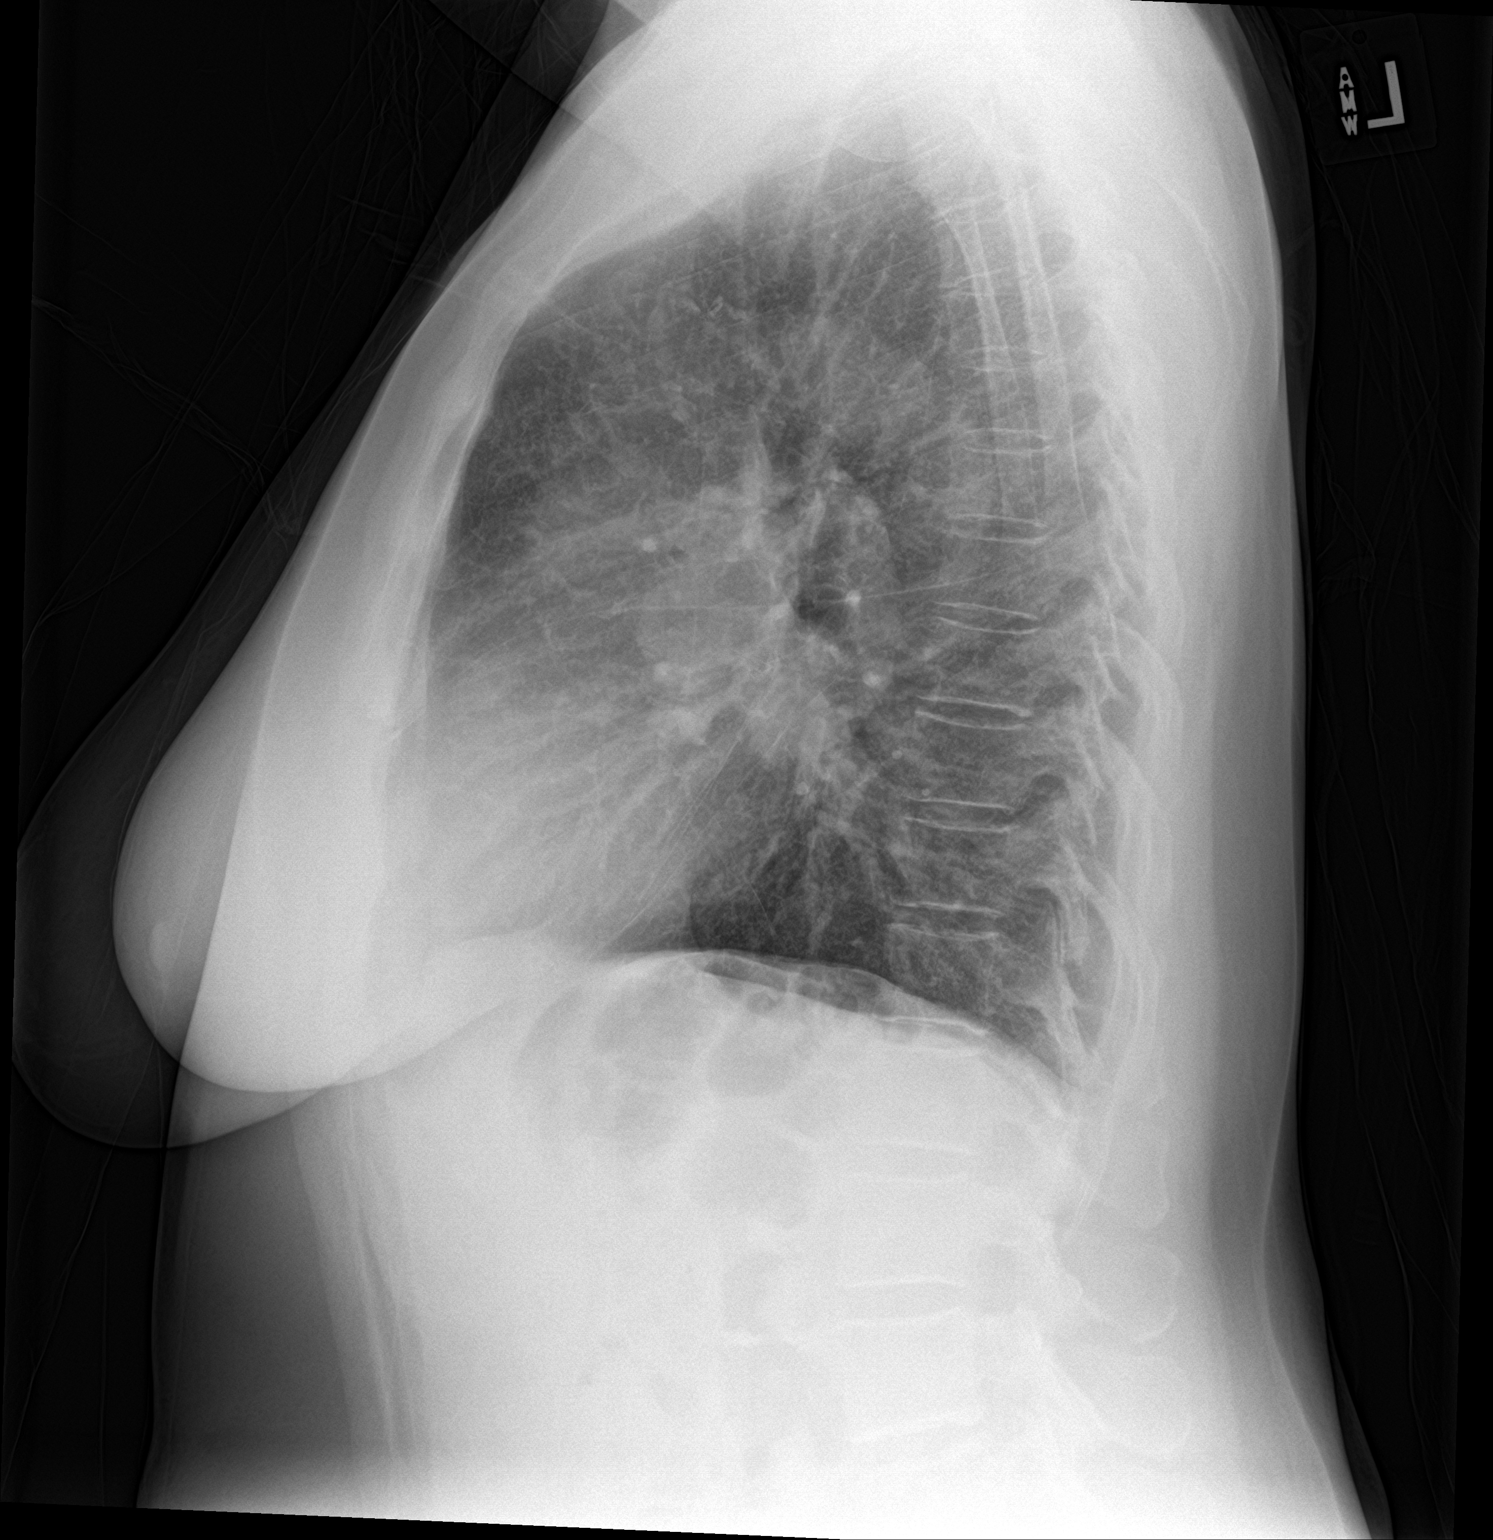

[2 of 2 positions shown; findings below may reference images not displayed]

FINDINGS: The heart size and mediastinal contours are within normal limits.
Aortic atherosclerosis. Mild pulmonary hyperinflation again noted,
likely due to COPD. Both lungs are clear. The visualized skeletal
structures are unremarkable.
IMPRESSION: Stable exam.  Probable COPD.  No active cardiopulmonary disease.

## 2022-02-28 ENCOUNTER — Other Ambulatory Visit: Payer: Self-pay

## 2022-02-28 ENCOUNTER — Encounter: Payer: Self-pay | Admitting: Emergency Medicine

## 2022-02-28 ENCOUNTER — Emergency Department
Admission: EM | Admit: 2022-02-28 | Discharge: 2022-02-28 | Disposition: A | Discharge status: Home / Self Care | Payer: Medicaid Other | Attending: Student in an Organized Health Care Education/Training Program | Admitting: Student in an Organized Health Care Education/Training Program

## 2022-02-28 DIAGNOSIS — Z91010 Allergy to peanuts: Secondary | ICD-10-CM | POA: Insufficient documentation

## 2022-02-28 DIAGNOSIS — Z20822 Contact with and (suspected) exposure to covid-19: Secondary | ICD-10-CM | POA: Insufficient documentation

## 2022-02-28 DIAGNOSIS — J101 Influenza due to other identified influenza virus with other respiratory manifestations: Secondary | ICD-10-CM | POA: Insufficient documentation

## 2022-02-28 DIAGNOSIS — R509 Fever, unspecified: Secondary | ICD-10-CM

## 2022-02-28 DIAGNOSIS — J111 Influenza due to unidentified influenza virus with other respiratory manifestations: Secondary | ICD-10-CM

## 2022-02-28 LAB — RESP PANEL BY RT-PCR (RSV, FLU A&B, COVID)  RVPGX2
Influenza A by PCR: POSITIVE — AB
Influenza B by PCR: NEGATIVE
Resp Syncytial Virus by PCR: NEGATIVE
SARS Coronavirus 2 by RT PCR: NEGATIVE

## 2022-02-28 MED ORDER — OSELTAMIVIR PHOSPHATE 75 MG PO CAPS: 75.0000 mg | ORAL_CAPSULE | Freq: Two times a day (BID) | ORAL | 0 refills | Status: AC

## 2022-02-28 MED ORDER — ONDANSETRON 4 MG PO TBDP: 4.0000 mg | ORAL_TABLET | Freq: Three times a day (TID) | ORAL | 0 refills | Status: AC | PRN

## 2022-02-28 NOTE — Discharge Instructions (Signed)
Please take Tamiflu as prescribed.  Make sure you drink lots of fluids.  Use Zofran as needed for any nausea.  Please alternate Tylenol and ibuprofen as needed for fevers chills body aches.  Take over-the-counter cough and cold medication as needed for cough congestion or runny nose.  Return to the ER for any signs of difficulty breathing, fevers above 102 that or not going down with Tylenol or ibuprofen, worsening symptoms or any urgent changes in health

## 2022-02-28 NOTE — ED Triage Notes (Signed)
Patient to ED via POV from home for flu/covid like symptoms. Patient states symptoms started this AM around 5AM. C/o head congestion, body aches, and cough.

## 2022-02-28 NOTE — ED Provider Notes (Signed)
[facility] EMERGENCY DEPARTMENT Provider Note   CSN: [id number] Arrival date & time: 02/28/22  1637     History  Chief Complaint  Patient presents with   URI    Vanessa Jacobs is a 56 y.o. female.  With no past medical history presents to the emergency department for evaluation of fever, chills, body aches, headache, cough congestion and runny nose and nausea.  Symptoms started all of a sudden this morning.  She took Tylenol with little relief.  She has not had any ibuprofen.  No vomiting but mild nausea.  No complaints of chest pain, shortness of breath, abdominal pain or diarrhea.  She is tolerating p.o. well.  HPI     Home Medications Prior to Admission medications   Medication Sig Start Date End Date Taking? Authorizing Provider  ondansetron (ZOFRAN-ODT) 4 MG disintegrating tablet Take 1 tablet (4 mg total) by mouth every 8 (eight) hours as needed for nausea or vomiting. 02/28/22  Yes Evon Slack, PA-C  oseltamivir (TAMIFLU) 75 MG capsule Take 1 capsule (75 mg total) by mouth 2 (two) times daily for 5 days. 02/28/22 03/05/22 Yes Evon Slack, PA-C  predniSONE (STERAPRED UNI-PAK 21 TAB) 10 MG (21) TBPK tablet Take 6 pills on day one then decrease by 1 pill each day 04/03/18   Faythe Ghee, PA-C  triamcinolone cream (KENALOG) 0.1 % Apply 1 application topically 2 (two) times daily. 04/03/18   Fisher, Roselyn Bering, PA-C      Allergies    Almond meal, Penicillins, Milk-related compounds, and Peanut-containing drug products    Review of Systems   Review of Systems  Physical Exam Updated Vital Signs BP (!) 147/97 (BP Location: Left Arm)   Pulse 84   Temp 99.9 F (37.7 C) (Oral)   Resp 18   SpO2 91%  Physical Exam Constitutional:      General: She is not in acute distress.    Appearance: She is well-developed.  HENT:     Head: Normocephalic and atraumatic.     Jaw: No trismus.     Right Ear: Hearing, tympanic membrane, ear canal and external ear  normal.     Left Ear: Hearing, tympanic membrane, ear canal and external ear normal.     Nose: Rhinorrhea present.     Mouth/Throat:     Mouth: Mucous membranes are moist.     Pharynx: Posterior oropharyngeal erythema present. No oropharyngeal exudate or uvula swelling.     Tonsils: No tonsillar exudate or tonsillar abscesses.  Eyes:     General:        Right eye: No discharge.        Left eye: No discharge.     Conjunctiva/sclera: Conjunctivae normal.  Cardiovascular:     Rate and Rhythm: Normal rate and regular rhythm.     Pulses: Normal pulses.     Heart sounds: Normal heart sounds.  Pulmonary:     Effort: Pulmonary effort is normal. No respiratory distress.     Breath sounds: Normal breath sounds. No stridor. No wheezing or rales.  Abdominal:     General: There is no distension.     Palpations: Abdomen is soft.     Tenderness: There is no abdominal tenderness.  Musculoskeletal:        General: No deformity. Normal range of motion.     Cervical back: Normal range of motion.  Lymphadenopathy:     Cervical: Cervical adenopathy present.  Skin:    General:  Skin is warm and dry.  Neurological:     Mental Status: She is alert and oriented to person, place, and time.     Deep Tendon Reflexes: Reflexes are normal and symmetric.  Psychiatric:        Behavior: Behavior normal.        Thought Content: Thought content normal.     ED Results / Procedures / Treatments   Labs (all labs ordered are listed, but only abnormal results are displayed) Labs Reviewed  RESP PANEL BY RT-PCR (RSV, FLU A&B, COVID)  RVPGX2 - Abnormal; Notable for the following components:      Result Value   Influenza A by PCR POSITIVE (*)    All other components within normal limits    EKG None  Radiology No results found.  Procedures Procedures    Medications Ordered in ED Medications - No data to display  ED Course/ Medical Decision Making/ A&P                           Medical Decision  Making Risk Prescription drug management.   56 year old female with sudden onset of flulike symptoms.  Symptoms began today.  No chest pain or shortness of breath.  Lungs are clear to auscultation.  Positive influenza A.  Patient will continue with Tylenol, start taking ibuprofen and will start Tamiflu.  Patient given Zofran for nausea.  She understands signs symptoms return to the ER for. Final Clinical Impression(s) / ED Diagnoses Final diagnoses:  Influenza  Fever, unspecified fever cause    Rx / DC Orders ED Discharge Orders          Ordered    oseltamivir (TAMIFLU) 75 MG capsule  2 times daily        02/28/22 1913    ondansetron (ZOFRAN-ODT) 4 MG disintegrating tablet  Every 8 hours PRN        02/28/22 1913              Ronnette Juniper 02/28/22 1917    Willy Eddy, MD 02/28/22 2201

## 2022-02-28 NOTE — ED Provider Triage Note (Signed)
Emergency Medicine Provider Triage Evaluation Note  Vanessa Jacobs , a 56 y.o. female  was evaluated in triage.  Pt complains of body aches, nasal congestion since this morning. Feels like she has a fever but did not take her temperature. No sick contacts  Review of Systems  Positive: body aches, nasal congestion Negative: Cp, sob  Physical Exam  There were no vitals taken for this visit. Gen:   Awake, no distress   Resp:  Normal effort  MSK:   Moves extremities without difficulty Other:    Medical Decision Making  Medically screening exam initiated at 5:05 PM.  Appropriate orders placed.  Vanessa Jacobs was informed that the remainder of the evaluation will be completed by another provider, this initial triage assessment does not replace that evaluation, and the importance of remaining in the ED until their evaluation is complete.     Jackelyn Hoehn, PA-C 02/28/22 1707

## 2023-10-13 ENCOUNTER — Other Ambulatory Visit
